# Patient Record
Sex: Female | Born: 1985
Health system: Southern US, Community
[De-identification: ages and names within clinical notes are randomized; demographics above are authoritative.]

## PROBLEM LIST (undated history)

## (undated) ENCOUNTER — Inpatient Hospital Stay (HOSPITAL_COMMUNITY): Payer: Self-pay

## (undated) DIAGNOSIS — R87619 Unspecified abnormal cytological findings in specimens from cervix uteri: Secondary | ICD-10-CM

## (undated) DIAGNOSIS — IMO0002 Reserved for concepts with insufficient information to code with codable children: Secondary | ICD-10-CM

## (undated) DIAGNOSIS — U071 COVID-19: Secondary | ICD-10-CM

## (undated) HISTORY — PX: DILATION AND CURETTAGE OF UTERUS: SHX78

---
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
## 2006-01-16 ENCOUNTER — Emergency Department (HOSPITAL_COMMUNITY): Admission: EM | Admit: 2006-01-16 | Discharge: 2006-01-16 | Payer: Self-pay | Admitting: Emergency Medicine

## 2006-01-25 ENCOUNTER — Emergency Department (HOSPITAL_COMMUNITY): Admission: EM | Admit: 2006-01-25 | Discharge: 2006-01-26 | Payer: Self-pay | Admitting: Emergency Medicine

## 2006-05-19 ENCOUNTER — Other Ambulatory Visit: Admission: RE | Admit: 2006-05-19 | Discharge: 2006-05-19 | Payer: Self-pay | Admitting: Obstetrics and Gynecology

## 2006-05-20 ENCOUNTER — Emergency Department (HOSPITAL_COMMUNITY): Admission: EM | Admit: 2006-05-20 | Discharge: 2006-05-20 | Payer: Self-pay | Admitting: Emergency Medicine

## 2006-05-26 ENCOUNTER — Inpatient Hospital Stay (HOSPITAL_COMMUNITY): Admission: AD | Admit: 2006-05-26 | Discharge: 2006-05-26 | Payer: Self-pay | Admitting: Obstetrics and Gynecology

## 2006-09-09 ENCOUNTER — Inpatient Hospital Stay (HOSPITAL_COMMUNITY): Admission: AD | Admit: 2006-09-09 | Discharge: 2006-09-09 | Payer: Self-pay | Admitting: Obstetrics and Gynecology

## 2006-09-09 ENCOUNTER — Inpatient Hospital Stay (HOSPITAL_COMMUNITY): Admission: AD | Admit: 2006-09-09 | Discharge: 2006-09-11 | Payer: Self-pay | Admitting: Obstetrics and Gynecology

## 2008-04-14 ENCOUNTER — Emergency Department (HOSPITAL_COMMUNITY): Admission: EM | Admit: 2008-04-14 | Discharge: 2008-04-15 | Payer: Self-pay | Admitting: Emergency Medicine

## 2008-06-05 ENCOUNTER — Emergency Department (HOSPITAL_COMMUNITY): Admission: EM | Admit: 2008-06-05 | Discharge: 2008-06-06 | Payer: Self-pay | Admitting: Emergency Medicine

## 2010-03-22 ENCOUNTER — Emergency Department (HOSPITAL_COMMUNITY)
Admission: EM | Admit: 2010-03-22 | Discharge: 2010-03-22 | Payer: Self-pay | Source: Home / Self Care | Admitting: Emergency Medicine

## 2010-09-11 ENCOUNTER — Emergency Department (HOSPITAL_COMMUNITY)
Admission: EM | Admit: 2010-09-11 | Discharge: 2010-09-11 | Payer: Self-pay | Source: Home / Self Care | Admitting: Emergency Medicine

## 2010-10-13 ENCOUNTER — Emergency Department (HOSPITAL_COMMUNITY)
Admission: EM | Admit: 2010-10-13 | Discharge: 2010-10-13 | Payer: Self-pay | Source: Home / Self Care | Admitting: Emergency Medicine

## 2010-10-15 LAB — URINALYSIS, ROUTINE W REFLEX MICROSCOPIC
Bilirubin Urine: NEGATIVE
Ketones, ur: 40 mg/dL — AB
Leukocytes, UA: NEGATIVE
Nitrite: NEGATIVE
Protein, ur: NEGATIVE mg/dL
Specific Gravity, Urine: 1.026 (ref 1.005–1.030)
Urine Glucose, Fasting: NEGATIVE mg/dL
Urobilinogen, UA: 1 mg/dL (ref 0.0–1.0)
pH: 7 (ref 5.0–8.0)

## 2010-10-15 LAB — URINE MICROSCOPIC-ADD ON

## 2010-10-15 LAB — POCT PREGNANCY, URINE: Preg Test, Ur: POSITIVE

## 2010-10-16 ENCOUNTER — Inpatient Hospital Stay (HOSPITAL_COMMUNITY)
Admission: AD | Admit: 2010-10-16 | Discharge: 2010-10-16 | Payer: Self-pay | Source: Home / Self Care | Attending: Obstetrics & Gynecology | Admitting: Obstetrics & Gynecology

## 2010-10-20 LAB — URINE MICROSCOPIC-ADD ON

## 2010-10-20 LAB — CBC
HCT: 37.1 % (ref 36.0–46.0)
Hemoglobin: 12.5 g/dL (ref 12.0–15.0)
MCH: 30.3 pg (ref 26.0–34.0)
MCHC: 33.7 g/dL (ref 30.0–36.0)
MCV: 89.8 fL (ref 78.0–100.0)
Platelets: 231 10*3/uL (ref 150–400)
RBC: 4.13 MIL/uL (ref 3.87–5.11)
RDW: 12 % (ref 11.5–15.5)
WBC: 7 10*3/uL (ref 4.0–10.5)

## 2010-10-20 LAB — URINALYSIS, ROUTINE W REFLEX MICROSCOPIC
Ketones, ur: >80 mg/dL — AB
Leukocytes, UA: NEGATIVE
Nitrite: NEGATIVE
Protein, ur: NEGATIVE mg/dL
Specific Gravity, Urine: 1.025 (ref 1.005–1.030)
Urine Glucose, Fasting: NEGATIVE mg/dL
Urobilinogen, UA: 2 mg/dL — ABNORMAL HIGH (ref 0.0–1.0)
pH: 6 (ref 5.0–8.0)

## 2010-10-20 LAB — WET PREP, GENITAL
Trich, Wet Prep: NONE SEEN
Yeast Wet Prep HPF POC: NONE SEEN

## 2010-10-20 LAB — GC/CHLAMYDIA PROBE AMP, GENITAL
Chlamydia, DNA Probe: NEGATIVE
GC Probe Amp, Genital: NEGATIVE

## 2010-10-20 LAB — ABO/RH: ABO/RH(D): O POS

## 2010-10-20 LAB — HCG, QUANTITATIVE, PREGNANCY: hCG, Beta Chain, Quant, S: 52327 m[IU]/mL — ABNORMAL HIGH (ref <5)

## 2010-10-25 ENCOUNTER — Inpatient Hospital Stay (HOSPITAL_COMMUNITY)
Admission: AD | Admit: 2010-10-25 | Discharge: 2010-10-26 | Payer: Self-pay | Source: Home / Self Care | Attending: Obstetrics & Gynecology | Admitting: Obstetrics & Gynecology

## 2010-10-25 LAB — URINALYSIS, ROUTINE W REFLEX MICROSCOPIC
Ketones, ur: >80 mg/dL — AB
Nitrite: NEGATIVE
Protein, ur: NEGATIVE mg/dL
Specific Gravity, Urine: 1.025 (ref 1.005–1.030)
Urine Glucose, Fasting: NEGATIVE mg/dL
Urobilinogen, UA: 2 mg/dL — ABNORMAL HIGH (ref 0.0–1.0)
pH: 6 (ref 5.0–8.0)

## 2010-10-25 LAB — URINE MICROSCOPIC-ADD ON

## 2010-10-28 LAB — URINE CULTURE: Culture  Setup Time: 201201291151

## 2010-12-08 LAB — URINALYSIS, ROUTINE W REFLEX MICROSCOPIC
Bilirubin Urine: NEGATIVE
Glucose, UA: NEGATIVE mg/dL
Ketones, ur: NEGATIVE mg/dL
Nitrite: NEGATIVE
Protein, ur: NEGATIVE mg/dL
Specific Gravity, Urine: 1.019 (ref 1.005–1.030)
Urobilinogen, UA: 1 mg/dL (ref 0.0–1.0)
pH: 8 (ref 5.0–8.0)

## 2010-12-08 LAB — URINE MICROSCOPIC-ADD ON

## 2010-12-08 LAB — POCT PREGNANCY, URINE: Preg Test, Ur: NEGATIVE

## 2010-12-14 LAB — URINE CULTURE: Colony Count: >=100000

## 2010-12-14 LAB — URINALYSIS, ROUTINE W REFLEX MICROSCOPIC
Bilirubin Urine: NEGATIVE
Glucose, UA: NEGATIVE mg/dL
Hgb urine dipstick: NEGATIVE
Ketones, ur: NEGATIVE mg/dL
Specific Gravity, Urine: 1.03 (ref 1.005–1.030)
pH: 6.5 (ref 5.0–8.0)

## 2010-12-14 LAB — WET PREP, GENITAL: Trich, Wet Prep: NONE SEEN

## 2010-12-14 LAB — URINE MICROSCOPIC-ADD ON

## 2010-12-14 LAB — GC/CHLAMYDIA PROBE AMP, GENITAL: Chlamydia, DNA Probe: NEGATIVE

## 2011-02-13 NOTE — Discharge Summary (Signed)
NAME:  Alexandra Davidson, Alexandra Davidson NO.:  [id number]   MEDICAL RECORD NO.:  [id number]          PATIENT TYPE:  INP   LOCATION:  9126                          FACILITY:  [facility]   PHYSICIAN:  Huel Cote, M.D. DATE OF BIRTH:  12-26-1985   DATE OF ADMISSION:  01/30/2005  DATE OF DISCHARGE:  02/02/2005                                 DISCHARGE SUMMARY   DISCHARGE DIAGNOSES:  1.  Preterm pregnancy at 36 plus weeks delivered.  2.  Fetal tachycardia.  3.  Positive group B strep status.  4.  Status post normal spontaneous vaginal delivery.  5.  Possible urinary tract infection and pyelonephritis.  6.  Postpartum.   HOSPITAL COURSE:  The patient is an 25 year old G1, P0 who was admitted at  36 plus weeks gestation with contractions every five minutes. Fetal heart  rate on admission was noted to be 180-200 which was reassuring with  accelerations but also tachycardic.  The patient was afebrile, however, was  admitted for observation. Fetal heart rate did improve to 150s with fluids  and IV hydration and the patient was observed see if her labor progressed.  Prenatal care had been complicated only by her positive group B strep status  in the urine. Prenatal labs were as follows:  O positive, antibody negative,  rubella immune, RPR nonreactive, hepatitis B surface antigen negative, HIV  nonreactive, GC negative, Chlamydia negative, group B strep positive, one-  hour Glucola 72, triple screen normal. She had no past obstetrical history.  No medical history and no surgical history.  Gynecologically, she had a  remote history of Chlamydia greater than one year ago.  It was negative this  pregnancy.   ALLERGIES:  None.   On admission, she was afebrile with stable vital signs. Fetal heart rate was  as described initially tachycardiac then improving to 150s.  Cervical exam  changed from 50 and 1 to 80 and 2 to 3 with contractions, confirming that  the patient was in labor.  She was  placed on penicillin for her group B  strep status.  A white blood cell count on admission was 11.5.  Given that  she was noted to be in labor, the rupture of membranes was then performed.  The patient continued to progress and did require some Pitocin augmentation.  She eventually reached complete dilation and pushed well with a normal  spontaneous vaginal delivery of a vigorous female infant over an intact  perineum. Apgars were 8 and 9, weight was 7 pounds even.  Cervix, perineum  and vagina were all intact. The urine was noted to be bloody during labor,  therefore was sent for urinalysis which was positive for blood and  leukocytes.  Given the tachycardia of the fetus and clinical picture, the  patient was treated for possible significant UTI with a one dose of  gentamycin and changed to Unasyn for several additional doses before  discontinuing.  She on postpartum day #2 was doing very well, was afebrile  with stable vital signs and was felt stable for discharge home.      KR/MEDQ  D:  03/18/2005  T:  03/18/2005  Job:  [id number]

## 2011-02-13 NOTE — Discharge Summary (Signed)
NAME:  Alexandra Davidson, FOGG NO.:  [id number]   MEDICAL RECORD NO.:  [id number]          PATIENT TYPE:  INP   LOCATION:  9145                          FACILITY:  [facility]   PHYSICIAN:  Malachi Pro. Ambrose Mantle, M.D. DATE OF BIRTH:  Dec 13, 1985   DATE OF ADMISSION:  09/09/2006  DATE OF DISCHARGE:  09/11/2006                               DISCHARGE SUMMARY   HISTORY:  This was a 25 year old black female para 0-1-0-1, gravida 2 at  39-3/[redacted] weeks gestation; admitted in active labor.  Her contractions were  increasing in intensity and frequency.  She had good fetal movement.  Her prenatal labs showed a blood group O+ with negative antibody. Sickle  cell negative.  GC and chlamydia were both positive, test cure was  negative.  RPR nonreactive, rubella immune.  Cystic fibrosis negative.  Hepatitis B surface antigen negative.  HIV negative.  A 1-hour Glucola  89.  Group B strep negative.   PAST MEDICAL HISTORY:  Negative.   SURGICAL HISTORY:  Negative.  The patient gave a history of a  spontaneous vaginal delivery at 36 weeks.  She had a history of abnormal  Paps.  Positive STDs with chlamydia and gonorrhea.  ]   MEDICATIONS:  She was on prenatal vitamins.   ALLERGIES:  She had NO KNOWN DRUG ALLERGIES.   SOCIAL HISTORY:  She was single.   FAMILY HISTORY:  Maternal grandmother with high blood pressure.  Paternal aunt with diabetes mellitus.   CURRENT PREGNANCY HISTORY:  Ultrasound due date was September 12, 2006 on  May 11, 2006.  at 22-2/[redacted] weeks gestation.  Normal anatomy was seen.  There was a posterior placenta female infant.   HOSPITAL COURSE:  On admission her vital signs were normal.  Heart and  lungs were normal.  The abdomen was soft and nontender.  Dr. Emeline Darling  exam showed the cervix to be 8-9 cm, 90%, vertex presentation, some  variable decelerations.  The patient progressed to complete dilatation  and pushed and delivered a living female infant; with Apgars of 8 and 9  at 1 and 5 minutes; 6 pounds 9 ounces.  The placenta was intact.  There  were no lacerations.  Blood loss was less than 500 cc.   Postpartum the patient did quite well.  She had no significant problems  and was discharged on the second postpartum day.   LABORATORY DATA:  Showed initial hemoglobin of 13.4, hematocrit 38.6,  white count 12,300, platelet count 232,000.  Follow-up hemoglobin 12.1,  RPR nonreactive.   FINAL DIAGNOSIS:  Intrauterine pregnancy at 39+ weeks; delivered OA.  Operation spontaneous delivery OA.   FINAL CONDITION:  Improved.   INSTRUCTIONS:  Include our regular discharge instruction booklet.  Percocet 5/325 mg 24 tablets one every 4-6 hours as needed for pain.  The patient is advised to return to the office in 6 weeks for a follow-  up examination.      Malachi Pro. Ambrose Mantle, M.D.  Electronically Signed     TFH/MEDQ  D:  09/11/2006  T:  09/11/2006  Job:  [id number]

## 2011-06-26 LAB — RAPID STREP SCREEN (MED CTR MEBANE ONLY): Streptococcus, Group A Screen (Direct): POSITIVE — AB

## 2011-08-20 ENCOUNTER — Inpatient Hospital Stay (HOSPITAL_COMMUNITY)
Admission: AD | Admit: 2011-08-20 | Discharge: 2011-08-20 | Disposition: A | Discharge status: Home / Self Care | Payer: Self-pay | Source: Ambulatory Visit | Attending: Obstetrics and Gynecology | Admitting: Obstetrics and Gynecology

## 2011-08-20 ENCOUNTER — Inpatient Hospital Stay (HOSPITAL_COMMUNITY): Payer: Self-pay

## 2011-08-20 ENCOUNTER — Encounter (HOSPITAL_COMMUNITY): Payer: Self-pay | Admitting: *Deleted

## 2011-08-20 DIAGNOSIS — O009 Unspecified ectopic pregnancy without intrauterine pregnancy: Secondary | ICD-10-CM | POA: Diagnosis present

## 2011-08-20 DIAGNOSIS — O209 Hemorrhage in early pregnancy, unspecified: Secondary | ICD-10-CM | POA: Insufficient documentation

## 2011-08-20 DIAGNOSIS — N760 Acute vaginitis: Secondary | ICD-10-CM | POA: Insufficient documentation

## 2011-08-20 DIAGNOSIS — A499 Bacterial infection, unspecified: Secondary | ICD-10-CM | POA: Insufficient documentation

## 2011-08-20 DIAGNOSIS — O2390 Unspecified genitourinary tract infection in pregnancy, unspecified trimester: Secondary | ICD-10-CM | POA: Insufficient documentation

## 2011-08-20 DIAGNOSIS — B9689 Other specified bacterial agents as the cause of diseases classified elsewhere: Secondary | ICD-10-CM | POA: Insufficient documentation

## 2011-08-20 HISTORY — DX: Reserved for concepts with insufficient information to code with codable children: IMO0002

## 2011-08-20 HISTORY — DX: Unspecified abnormal cytological findings in specimens from cervix uteri: R87.619

## 2011-08-20 LAB — URINALYSIS, ROUTINE W REFLEX MICROSCOPIC
Bilirubin Urine: NEGATIVE
Ketones, ur: 40 mg/dL — AB
Nitrite: NEGATIVE
Specific Gravity, Urine: >1.03 — ABNORMAL HIGH (ref 1.005–1.030)
pH: 6 (ref 5.0–8.0)

## 2011-08-20 LAB — AST: AST: 10 U/L (ref 0–37)

## 2011-08-20 LAB — WET PREP, GENITAL: Yeast Wet Prep HPF POC: NONE SEEN

## 2011-08-20 LAB — URINE MICROSCOPIC-ADD ON

## 2011-08-20 LAB — CBC
HCT: 34.1 % — ABNORMAL LOW (ref 36.0–46.0)
Hemoglobin: 11.5 g/dL — ABNORMAL LOW (ref 12.0–15.0)
MCH: 31.1 pg (ref 26.0–34.0)
MCHC: 33.7 g/dL (ref 30.0–36.0)
MCV: 92.2 fL (ref 78.0–100.0)
RDW: 11.7 % (ref 11.5–15.5)

## 2011-08-20 LAB — CREATININE, SERUM
GFR calc Af Amer: >90 mL/min (ref >90)
GFR calc non Af Amer: >90 mL/min (ref >90)

## 2011-08-20 MED ORDER — METHOTREXATE INJECTION FOR WOMEN'S HOSPITAL
50.0000 mg/m2 | Freq: Once | INTRAMUSCULAR | Status: DC
  Filled 2011-08-20: qty 2.2

## 2011-08-20 MED ORDER — METRONIDAZOLE 500 MG PO TABS: 500.0000 mg | ORAL_TABLET | Freq: Two times a day (BID) | ORAL | Status: AC

## 2011-08-20 MED ORDER — PROMETHAZINE HCL 25 MG PO TABS: 25.0000 mg | ORAL_TABLET | Freq: Four times a day (QID) | ORAL | Status: DC | PRN

## 2011-08-20 MED ORDER — DEXTROSE 5 % IN LACTATED RINGERS IV BOLUS
1000.0000 mL | Freq: Once | INTRAVENOUS | Status: AC
  Administered 2011-08-20: 1000 mL via INTRAVENOUS

## 2011-08-20 MED ORDER — ONDANSETRON HCL 4 MG/2ML IJ SOLN
4.0000 mg | Freq: Once | INTRAMUSCULAR | Status: AC
  Administered 2011-08-20: 4 mg via INTRAVENOUS
  Filled 2011-08-20: qty 2

## 2011-08-20 NOTE — Progress Notes (Signed)
Pt G4 P2 at 12wks, having small amt of dark blood and nausea.

## 2011-08-20 NOTE — ED Notes (Signed)
Pt requesting to talk to Dr. Emelda Fear. Unsure if she want methotrexate now. CNM aware and will notify.

## 2011-08-20 NOTE — ED Provider Notes (Signed)
History     Chief Complaint  Patient presents with  . Vaginal Bleeding  . Nausea   HPI 25 y.o. G1P0 at [redacted]w[redacted]d by LMP, periods are very irregular, with nausea and vaginal bleeding, light pink starting on Monday, tonight bright red with wiping. Last intercourse yesterday.   Past Medical History  Diagnosis Date  . Abnormal Pap smear     History reviewed. No pertinent past surgical history.  History reviewed. No pertinent family history.  History  Substance Use Topics  . Smoking status: Never Smoker   . Smokeless tobacco: Not on file  . Alcohol Use: No    Allergies: No Known Allergies  Prescriptions prior to admission  Medication Sig Dispense Refill  . acetaminophen (TYLENOL) 325 MG tablet Take 325 mg by mouth every 6 (six) hours as needed. For pain       . Dextromethorphan-Guaifenesin (CHILDRENS COUGH PO) Take 5 mLs by mouth daily as needed. For cough       . prenatal vitamin w/FE, FA (PRENATAL 1 + 1) 27-1 MG TABS Take 1 tablet by mouth daily.          Review of Systems  Constitutional: Negative.   Respiratory: Negative.   Cardiovascular: Negative.   Gastrointestinal: Positive for nausea. Negative for vomiting, abdominal pain, diarrhea and constipation.  Genitourinary: Negative for dysuria, urgency, frequency, hematuria and flank pain.       Positive for vaginal bleeding   Musculoskeletal: Negative.   Neurological: Negative.   Psychiatric/Behavioral: Negative.    Physical Exam   Blood pressure 105/54, pulse 64, temperature 97.1 F (36.2 C), temperature source Oral, resp. rate 20, height 5\' 8"  (1.727 m), weight 97.433 kg (214 lb 12.8 oz).  Physical Exam  Nursing note and vitals reviewed. Constitutional: She is oriented to person, place, and time. She appears well-developed and well-nourished. No distress.  Cardiovascular: Normal rate.   GI: Soft. There is no tenderness.  Genitourinary: Uterus is not tender. Right adnexum displays no mass, no tenderness and no  fullness. Left adnexum displays no mass, no tenderness and no fullness. There is bleeding (light pink mucous) around the vagina.  Musculoskeletal: Normal range of motion.  Neurological: She is alert and oriented to person, place, and time.  Skin: Skin is warm and dry.  Psychiatric: She has a normal mood and affect.    MAU Course  Procedures Results for orders placed during the hospital encounter of 08/20/11 (from the past 24 hour(s))  URINALYSIS, ROUTINE W REFLEX MICROSCOPIC     Status: Abnormal   Collection Time   08/20/11  3:28 AM      Component Value Range   Color, Urine YELLOW  YELLOW    Appearance CLEAR  CLEAR    Specific Gravity, Urine >1.030 (*) 1.005 - 1.030    pH 6.0  5.0 - 8.0    Glucose, UA NEGATIVE  NEGATIVE (mg/dL)   Hgb urine dipstick LARGE (*) NEGATIVE    Bilirubin Urine NEGATIVE  NEGATIVE    Ketones, ur 40 (*) NEGATIVE (mg/dL)   Protein, ur NEGATIVE  NEGATIVE (mg/dL)   Urobilinogen, UA 0.2  0.0 - 1.0 (mg/dL)   Nitrite NEGATIVE  NEGATIVE    Leukocytes, UA NEGATIVE  NEGATIVE   URINE MICROSCOPIC-ADD ON     Status: Abnormal   Collection Time   08/20/11  3:28 AM      Component Value Range   Squamous Epithelial / LPF MANY (*) RARE    WBC, UA 0-2  <3 (WBC/hpf)  RBC / HPF 3-6  <3 (RBC/hpf)   Urine-Other MUCOUS PRESENT    CBC     Status: Abnormal   Collection Time   08/20/11  4:35 AM      Component Value Range   WBC 7.6  4.0 - 10.5 (K/uL)   RBC 3.70 (*) 3.87 - 5.11 (MIL/uL)   Hemoglobin 11.5 (*) 12.0 - 15.0 (g/dL)   HCT 16.1 (*) 09.6 - 46.0 (%)   MCV 92.2  78.0 - 100.0 (fL)   MCH 31.1  26.0 - 34.0 (pg)   MCHC 33.7  30.0 - 36.0 (g/dL)   RDW 04.5  40.9 - 81.1 (%)   Platelets 214  150 - 400 (K/uL)  HCG, QUANTITATIVE, PREGNANCY     Status: Abnormal   Collection Time   08/20/11  4:35 AM      Component Value Range   hCG, Beta Chain, Quant, S 7712 (*) <5 (mIU/mL)  WET PREP, GENITAL     Status: Abnormal   Collection Time   08/20/11  5:25 AM      Component Value  Range   Yeast, Wet Prep NONE SEEN  NONE SEEN    Trich, Wet Prep NONE SEEN  NONE SEEN    Clue Cells, Wet Prep MODERATE (*) NONE SEEN    WBC, Wet Prep HPF POC FEW (*) NONE SEEN    US Ob Comp Less 14 Wks  08/20/2011  *RADIOLOGY REPORT*  Clinical Data: Nausea, vomiting, bleeding. Early pregnancy.  OBSTETRIC <14 WK [place] AND TRANSVAGINAL OB US  Technique:  Both transabdominal and transvaginal ultrasound examinations were performed for complete evaluation of the gestation as well as the maternal uterus, adnexal regions, and pelvic cul-de-sac.  Transvaginal technique was performed to assess early pregnancy.  Comparison:  None.  Intrauterine gestational sac:  Not identified Yolk sac: Not identified Embryo: Not identified Cardiac Activity: Not applicable  Maternal uterus/adnexae: Ovaries have normal sonographic appearance.  Thickened endometrium. No adnexal mass identified.  No free fluid.  IMPRESSION: No intrauterine gestation identified.  No adnexal mass or free fluid. At the time of this examination, a quantitative beta HCG is pending.  Recommend serial quantitative beta HCG and short-term ultrasound follow-up.  Original Report Authenticated By: Waneta Martins, M.D.    Assessment and Plan  25 y.o. G2P0010, unsure dates, with vaginal bleeding and BV Quant HCG had to be sent out, after 2 hours lab states it will be at least another hour, pt states she cannot wait any longer d/t childcare issues at home. She agrees to come back in if we need her to, phone number verified.  Rx flagyl and phenergan  Terik Haughey 08/20/2011, 7:21 AM   Quant HCG 7712. Pt called back and immediately returned to MAU. Discussed ectopic and treatment with Methotrexate with patient. Dr. Emelda Fear to MAU to see patient. BUN, Creatinine, AST pending.  Pt expressed concern with moving forward with MTX treatment upon arrival to MAU. Dr. Emelda Fear discussed u/s with radiologist. Considering that patient appears to be very stable and  that u/s is not clearly diagnostic for ectopic, it would be reasonable to repeat ultrasound and quant on Friday. Precuations rev'd. Pt will return tomorrow or sooner with development of pain or increased bleeding.

## 2011-08-20 NOTE — ED Notes (Signed)
Pt decided to wait. U/s scheduled for tomorrow. Instructions for ectopic pregnancy given. Pt verbalized understanding.

## 2011-08-21 ENCOUNTER — Encounter (HOSPITAL_COMMUNITY): Payer: Self-pay | Admitting: *Deleted

## 2011-08-21 ENCOUNTER — Other Ambulatory Visit: Payer: Self-pay | Admitting: Obstetrics & Gynecology

## 2011-08-21 ENCOUNTER — Ambulatory Visit (HOSPITAL_COMMUNITY)
Admission: AD | Admit: 2011-08-21 | Discharge: 2011-08-21 | Disposition: A | Discharge status: Home / Self Care | Payer: Self-pay | Source: Ambulatory Visit | Attending: Obstetrics & Gynecology | Admitting: Obstetrics & Gynecology

## 2011-08-21 ENCOUNTER — Encounter (HOSPITAL_COMMUNITY)
Admission: AD | Disposition: A | Discharge status: Home / Self Care | Payer: Self-pay | Source: Ambulatory Visit | Attending: Obstetrics & Gynecology

## 2011-08-21 ENCOUNTER — Ambulatory Visit (HOSPITAL_COMMUNITY): Payer: Self-pay

## 2011-08-21 ENCOUNTER — Inpatient Hospital Stay (HOSPITAL_COMMUNITY): Payer: Self-pay | Admitting: Anesthesiology

## 2011-08-21 ENCOUNTER — Encounter (HOSPITAL_COMMUNITY): Payer: Self-pay | Admitting: Anesthesiology

## 2011-08-21 ENCOUNTER — Inpatient Hospital Stay (HOSPITAL_COMMUNITY): Payer: Self-pay

## 2011-08-21 DIAGNOSIS — O00109 Unspecified tubal pregnancy without intrauterine pregnancy: Principal | ICD-10-CM | POA: Insufficient documentation

## 2011-08-21 DIAGNOSIS — O00119 Unspecified tubal pregnancy with intrauterine pregnancy: Secondary | ICD-10-CM

## 2011-08-21 HISTORY — PX: LAPAROSCOPY: SHX197

## 2011-08-21 LAB — GC/CHLAMYDIA PROBE AMP, GENITAL: Chlamydia, DNA Probe: NEGATIVE

## 2011-08-21 SURGERY — LAPAROSCOPY OPERATIVE
Wound class: Clean Contaminated

## 2011-08-21 MED ORDER — NEOSTIGMINE METHYLSULFATE 1 MG/ML IJ SOLN
INTRAMUSCULAR | Status: DC | PRN
  Administered 2011-08-21: 2.5 mg via INTRAVENOUS

## 2011-08-21 MED ORDER — ONDANSETRON HCL 4 MG/2ML IJ SOLN
INTRAMUSCULAR | Status: DC | PRN
  Administered 2011-08-21: 4 mg via INTRAVENOUS

## 2011-08-21 MED ORDER — KETOROLAC TROMETHAMINE 30 MG/ML IJ SOLN
INTRAMUSCULAR | Status: DC | PRN
  Administered 2011-08-21: 30 mg via INTRAVENOUS

## 2011-08-21 MED ORDER — DEXAMETHASONE SODIUM PHOSPHATE 10 MG/ML IJ SOLN
INTRAMUSCULAR | Status: DC | PRN
  Administered 2011-08-21: 10 mg via INTRAVENOUS

## 2011-08-21 MED ORDER — MIDAZOLAM HCL 5 MG/5ML IJ SOLN
INTRAMUSCULAR | Status: DC | PRN
  Administered 2011-08-21: 2 mg via INTRAVENOUS

## 2011-08-21 MED ORDER — FENTANYL CITRATE 0.05 MG/ML IJ SOLN
25.0000 ug | INTRAMUSCULAR | Status: DC | PRN
  Administered 2011-08-21: 25 ug via INTRAVENOUS

## 2011-08-21 MED ORDER — MEPERIDINE HCL 25 MG/ML IJ SOLN: 6.2500 mg | INTRAMUSCULAR | Status: DC | PRN

## 2011-08-21 MED ORDER — BUPIVACAINE HCL 0.25 % IJ SOLN
INTRAMUSCULAR | Status: DC | PRN
  Administered 2011-08-21: 10 mL

## 2011-08-21 MED ORDER — LIDOCAINE HCL (CARDIAC) 20 MG/ML IV SOLN
INTRAVENOUS | Status: DC | PRN
  Administered 2011-08-21: 80 mg via INTRAVENOUS

## 2011-08-21 MED ORDER — OXYCODONE-ACETAMINOPHEN 5-325 MG PO TABS: 1.0000 | ORAL_TABLET | ORAL | Status: AC | PRN

## 2011-08-21 MED ORDER — ROCURONIUM BROMIDE 100 MG/10ML IV SOLN
INTRAVENOUS | Status: DC | PRN
  Administered 2011-08-21: 50 mg via INTRAVENOUS

## 2011-08-21 MED ORDER — RINGERS IRRIGATION IR SOLN
Status: DC | PRN
  Administered 2011-08-21: 1

## 2011-08-21 MED ORDER — GLYCOPYRROLATE 0.2 MG/ML IJ SOLN
INTRAMUSCULAR | Status: DC | PRN
  Administered 2011-08-21: .5 mg via INTRAVENOUS

## 2011-08-21 MED ORDER — ONDANSETRON HCL 4 MG/2ML IJ SOLN: 4.0000 mg | Freq: Once | INTRAMUSCULAR | Status: DC | PRN

## 2011-08-21 MED ORDER — PROPOFOL 10 MG/ML IV EMUL
INTRAVENOUS | Status: DC | PRN
  Administered 2011-08-21: 170 mg via INTRAVENOUS

## 2011-08-21 MED ORDER — LACTATED RINGERS IV SOLN
INTRAVENOUS | Status: DC
  Administered 2011-08-21 (×3): via INTRAVENOUS

## 2011-08-21 MED ORDER — KETOROLAC TROMETHAMINE 30 MG/ML IJ SOLN: 15.0000 mg | Freq: Once | INTRAMUSCULAR | Status: DC | PRN

## 2011-08-21 MED ORDER — FENTANYL CITRATE 0.05 MG/ML IJ SOLN
INTRAMUSCULAR | Status: DC | PRN
  Administered 2011-08-21: 150 ug via INTRAVENOUS
  Administered 2011-08-21: 100 ug via INTRAVENOUS

## 2011-08-21 SURGICAL SUPPLY — 27 items
CHLORAPREP W/TINT 26ML (MISCELLANEOUS) ×2 IMPLANT
CLOTH BEACON ORANGE TIMEOUT ST (SAFETY) ×2 IMPLANT
ELECT REM PT RETURN 9FT ADLT (ELECTROSURGICAL) ×2
ELECTRODE REM PT RTRN 9FT ADLT (ELECTROSURGICAL) ×1 IMPLANT
EXTRACTOR VACUUM KIWI (MISCELLANEOUS) IMPLANT
GLOVE BIO SURGEON STRL SZ 6.5 (GLOVE) ×2 IMPLANT
GLOVE BIOGEL PI IND STRL 7.0 (GLOVE) ×2 IMPLANT
GLOVE BIOGEL PI INDICATOR 7.0 (GLOVE) ×2
GOWN PREVENTION PLUS LG XLONG (DISPOSABLE) ×8 IMPLANT
KIT ABG SYR 3ML LUER SLIP (SYRINGE) IMPLANT
NEEDLE HYPO 25X5/8 SAFETYGLIDE (NEEDLE) IMPLANT
NS IRRIG 1000ML POUR BTL (IV SOLUTION) ×2 IMPLANT
PACK LAPAROSCOPY BASIN (CUSTOM PROCEDURE TRAY) ×2 IMPLANT
POUCH SPECIMEN RETRIEVAL 10MM (ENDOMECHANICALS) ×2 IMPLANT
SCALPEL HARMONIC ACE (MISCELLANEOUS) ×2 IMPLANT
SLEEVE SCD COMPRESS KNEE LRG (MISCELLANEOUS) ×2 IMPLANT
SLEEVE SCD COMPRESS KNEE MED (MISCELLANEOUS) IMPLANT
SUT VIC AB 0 CT1 36 (SUTURE) ×12 IMPLANT
SUT VIC AB 2-0 CT1 27 (SUTURE) ×2
SUT VIC AB 2-0 CT1 TAPERPNT 27 (SUTURE) ×1 IMPLANT
SUT VIC AB 4-0 PS2 27 (SUTURE) ×2 IMPLANT
TOWEL OR 17X24 6PK STRL BLUE (TOWEL DISPOSABLE) ×4 IMPLANT
TRAY FOLEY CATH 14FR (SET/KITS/TRAYS/PACK) IMPLANT
TROCAR 5M 150ML BLDLS (TROCAR) ×2 IMPLANT
TROCAR XCEL NON-BLD 11X100MML (ENDOMECHANICALS) ×4 IMPLANT
TUBING INSUFFLATION 10FT LAP (TUBING) ×2 IMPLANT
WATER STERILE IRR 1000ML POUR (IV SOLUTION) ×2 IMPLANT

## 2011-08-21 NOTE — Progress Notes (Signed)
Patient to MAU to have a repeat BHCG. Patient is also scheduled for a repeat ultrasound. Patient denies any pain or bleeding.

## 2011-08-21 NOTE — ED Notes (Signed)
Pt returned from [place]. Henrietta Hoover PA at bedside discussing plan of care. ( Pt has living ectopic).

## 2011-08-21 NOTE — Anesthesia Postprocedure Evaluation (Signed)
Anesthesia Post Note  Patient: Alexandra Davidson  Procedure(s) Performed:  LAPAROSCOPY OPERATIVE - Operative Laparoscopy with Left Partial Salpingectomy  Anesthesia type: General  Patient location: PACU  Post pain: Pain level controlled  Post assessment: Post-op Vital signs reviewed  Last Vitals:  Filed Vitals:   08/21/11 1745  BP: 129/74  Pulse: 69  Temp:   Resp: 19    Post vital signs: Reviewed  Level of consciousness: sedated  Complications: No apparent anesthesia complicationsfj

## 2011-08-21 NOTE — Op Note (Signed)
Procedure: Laparoscopy and partial left salpingectomy Preop diagnosis: Left tubal ectopic pregnancy with fetal heart motion seen on ultrasound Postop diagnosis: Same Surgeon: Dr. Scheryl Darter Assistant: Dr. Candelaria Celeste Anesthesia: Gen. endotracheal Specimen: Portion of left fallopian tube with ectopic pregnancy Complications: None Estimated blood loss less than 25 mL Drains: None Counts: Correct  The patient gave written consent for laparoscopy and removal of ectopic pregnancy with possible salpingectomy due to diagnosis of the left bleeding ectopic pregnancy at 6 weeks 1 day gestation based on ultrasound. Patient identification was confirmed and she was brought to the operating room and she was placed in dorsal lithotomy position. General endotracheal anesthesia was induced. Abdomen perineum and vagina were sterilely prepped and draped. A Foley catheter was placed and a Hulka tenaculum was placed on the cervix. A #11 blade was used to make a vertical infraumbilical skin incision approximately one and half centimeters long. The abdominal wall was elevated and a Veress needle was placed. CO2 was insufflated for a pneumoperitoneum. The needle was removed and an 11 mm trocar was placed. The diagnostic laparoscope was inserted with video camera and use. The uterus was then manipulated with the Hulka tenaculum. The left tube was dilated and swollen just distal to the cornu and about 5 cm of the proximal tube was involved in the ectopic pregnancy. Both ovaries appeared normal and the right fallopian tube appeared normal. There was no hemoperitoneum. Elected to proceed with excising the ectopic pregnancy with a partial left salpingectomy. 5 mm trochars were placed in the left lower quadrant and right lower quadrant. The tube was elevated with a grasper. The harmonic scalpel was used to excise the dilated portion of the left fallopian tube. A few adhesions in the left adnexa were taken down with scalpel as  well. Good hemostasis was seen. The Endopouch was inserted and this was used to remove the specimen. This was sent to pathology. The pelvis was irrigated. All instruments removed. The pneumoperitoneum was released. 0 Vicryl suture was used to close the fascia at the umbilicus. The skin at the umbilicus was closed with 4-0 Vicryl and subcuticular layer. The lower bowel incisions were closed with Dermabond. Dermabond was applied at the umbilicus as well. The Hulka tenaculum was removed. The patient tolerated the procedure well without complications. Estimated blood loss was less than 25 mL. She was brought in stable condition to the PACU.  Dr. Scheryl Darter 08/21/2011

## 2011-08-21 NOTE — H&P (Addendum)
Alexandra Davidson is an 25 y.o. female. [redacted]w[redacted]d No LMP recorded. Patient is pregnant.She returns today for repeat HCG after she was seen in MAU yesterday with possible ectopic pregnancy. Ultrasound 08/20/11 showed no IUP, no adnexal  mass. No bleeding today. Left ectopic noted on [place] today with fetal heart rate 120.     Pertinent Gynecological History: Menses irregular Bleeding: light yesterday Contraception: none DES exposure: denies Blood transfusions: none Sexually transmitted diseases: cervical dysplasia, chlamydia  Previous GYN Procedures: DNC  Last mammogram: n/a Date: n/a  Last pap: normal Date: unknown OB History: G4, P2, A1   Menstrual History: Menarche age: not recorded No LMP recorded. Patient is pregnant.    Past Medical History  Diagnosis Date  . Abnormal Pap smear     Past Surgical History  Procedure Date  . No past surgeries   DandC for termination   Family History  Problem Relation Age of Onset  . Anesthesia problems Neg Hx   . Hypotension Neg Hx   . Malignant hyperthermia Neg Hx   . Pseudochol deficiency Neg Hx     Social History:  reports that she has never smoked. She does not have any smokeless tobacco history on file. She reports that she does not drink alcohol or use illicit drugs.  Allergies: No Known Allergies  Prescriptions prior to admission  Medication Sig Dispense Refill  . acetaminophen (TYLENOL) 325 MG tablet Take 325 mg by mouth every 6 (six) hours as needed. For pain       . Dextromethorphan-Guaifenesin (CHILDRENS COUGH PO) Take 5 mLs by mouth daily as needed. For cough       . metroNIDAZOLE (FLAGYL) 500 MG tablet Take 1 tablet (500 mg total) by mouth 2 (two) times daily.  14 tablet  0  . prenatal vitamin w/FE, FA (PRENATAL 1 + 1) 27-1 MG TABS Take 1 tablet by mouth daily.        . promethazine (PHENERGAN) 25 MG tablet Take 1 tablet (25 mg total) by mouth every 6 (six) hours as needed for nausea.  60 tablet  0    Review of Systems    Constitutional: Negative.   Neurological: Negative for headaches.   No pain or bleeding  Blood pressure 125/76, pulse 79, temperature 98.7 F (37.1 C), temperature source Oral, resp. rate 16, SpO2 99.00%. Physical Exam  Constitutional: She is oriented to person, place, and time. She appears well-developed. No distress.  Cardiovascular: Normal rate.   Respiratory: Breath sounds normal.  GI: Soft. She exhibits no mass. There is no tenderness.  Genitourinary:       Deferred today  Neurological: She is alert and oriented to person, place, and time.  Skin: Skin is warm and dry.  Psychiatric: She has a normal mood and affect.    Results for orders placed during the hospital encounter of 08/21/11 (from the past 24 hour(s))  HCG, QUANTITATIVE, PREGNANCY     Status: Abnormal   Collection Time   08/21/11  2:15 PM      Component Value Range   hCG, Beta Chain, Quant, S 9042 (*) <5 (mIU/mL)    US Ob Comp Less 14 Wks  08/20/2011  *RADIOLOGY REPORT*  Clinical Data: Nausea, vomiting, bleeding. Early pregnancy.  OBSTETRIC <14 WK [place] AND TRANSVAGINAL OB US  Technique:  Both transabdominal and transvaginal ultrasound examinations were performed for complete evaluation of the gestation as well as the maternal uterus, adnexal regions, and pelvic cul-de-sac.  Transvaginal technique was performed to assess  early pregnancy.  Comparison:  None.  Intrauterine gestational sac:  Not identified Yolk sac: Not identified Embryo: Not identified Cardiac Activity: Not applicable  Maternal uterus/adnexae: Ovaries have normal sonographic appearance.  Thickened endometrium. No adnexal mass identified.  No free fluid.  IMPRESSION: No intrauterine gestation identified.  No adnexal mass or free fluid. At the time of this examination, a quantitative beta HCG is pending.  Recommend serial quantitative beta HCG and short-term ultrasound follow-up.  Original Report Authenticated By: Waneta Martins, M.D.   US Ob  Transvaginal  08/21/2011  OBSTETRICAL ULTRASOUND: This exam was performed within a [facility]. The OB US report was generated in the [facility] system, and faxed to the ordering physician.   This report is also available in [employer] and in the [employer]. See [facility] Obstetric US report.   US Ob Transvaginal  08/20/2011  *RADIOLOGY REPORT*  Clinical Data: Nausea, vomiting, bleeding. Early pregnancy.  OBSTETRIC <14 WK [place] AND TRANSVAGINAL OB US  Technique:  Both transabdominal and transvaginal ultrasound examinations were performed for complete evaluation of the gestation as well as the maternal uterus, adnexal regions, and pelvic cul-de-sac.  Transvaginal technique was performed to assess early pregnancy.  Comparison:  None.  Intrauterine gestational sac:  Not identified Yolk sac: Not identified Embryo: Not identified Cardiac Activity: Not applicable  Maternal uterus/adnexae: Ovaries have normal sonographic appearance.  Thickened endometrium. No adnexal mass identified.  No free fluid.  IMPRESSION: No intrauterine gestation identified.  No adnexal mass or free fluid. At the time of this examination, a quantitative beta HCG is pending.  Recommend serial quantitative beta HCG and short-term ultrasound follow-up.  Original Report Authenticated By: Waneta Martins, M.D.  08/21/11  [redacted]w[redacted]d living left ectopic pregnancy, no free fluid,  Assessment/Plan: Ectopic pregnancy on left. Not a candidate for MTX due to living fetus present. I offered laparoscopy and removal of the ectopic and possible salpingectomy. The risk of anesthesia, bleeding, infection, bowel and urinary tract damage was discussed and her questions were answered. She has binot eated since 2000 08/20/11.  Nyheim Seufert 08/21/2011, 3:54 PM

## 2011-08-21 NOTE — Anesthesia Preprocedure Evaluation (Signed)
Anesthesia Evaluation  Patient identified by MRN, date of birth, ID band Patient awake    Reviewed: Allergy & Precautions, H&P , NPO status , Patient's Chart, lab work & pertinent test results  Airway Mallampati: II TM Distance: >3 FB Neck ROM: full    Dental No notable dental hx. (+) Teeth Intact   Pulmonary neg pulmonary ROS,    Pulmonary exam normal       Cardiovascular neg cardio ROS     Neuro/Psych Negative Neurological ROS  Negative Psych ROS   GI/Hepatic negative GI ROS, Neg liver ROS,   Endo/Other  Negative Endocrine ROS  Renal/GU negative Renal ROS  Genitourinary negative   Musculoskeletal negative musculoskeletal ROS (+)   Abdominal Normal abdominal exam  (+)   Peds negative pediatric ROS (+)  Hematology negative hematology ROS (+)   Anesthesia Other Findings   Reproductive/Obstetrics negative OB ROS                           Anesthesia Physical Anesthesia Plan  ASA: II and Emergent  Anesthesia Plan: General   Post-op Pain Management:    Induction: Intravenous  Airway Management Planned: Oral ETT  Additional Equipment:   Intra-op Plan:   Post-operative Plan: Extubation in OR  Informed Consent: I have reviewed the patients History and Physical, chart, labs and discussed the procedure including the risks, benefits and alternatives for the proposed anesthesia with the patient or authorized representative who has indicated his/her understanding and acceptance.   Dental Advisory Given  Plan Discussed with: CRNA  Anesthesia Plan Comments:         Anesthesia Quick Evaluation

## 2011-08-21 NOTE — ED Notes (Signed)
Chg bath doneand op permit signed

## 2011-08-21 NOTE — ED Provider Notes (Signed)
History   Pt presents today for repeat US to r/o ectopic preg. Pt was seen yesterday and offered methotrexate for suspected ectopic preg. There was no IUP or ectopic preg seen on [place] yesterday. Pt denies vag bleeding or pain today.  Chief Complaint  Patient presents with  . Follow-up   HPI  OB History    Grav Para Term Preterm Abortions TAB SAB Ect Mult Living   2    1 1           Past Medical History  Diagnosis Date  . Abnormal Pap smear     No past surgical history on file.  No family history on file.  History  Substance Use Topics  . Smoking status: Never Smoker   . Smokeless tobacco: Not on file  . Alcohol Use: No    Allergies: No Known Allergies  Prescriptions prior to admission  Medication Sig Dispense Refill  . acetaminophen (TYLENOL) 325 MG tablet Take 325 mg by mouth every 6 (six) hours as needed. For pain       . Dextromethorphan-Guaifenesin (CHILDRENS COUGH PO) Take 5 mLs by mouth daily as needed. For cough       . metroNIDAZOLE (FLAGYL) 500 MG tablet Take 1 tablet (500 mg total) by mouth 2 (two) times daily.  14 tablet  0  . prenatal vitamin w/FE, FA (PRENATAL 1 + 1) 27-1 MG TABS Take 1 tablet by mouth daily.        . promethazine (PHENERGAN) 25 MG tablet Take 1 tablet (25 mg total) by mouth every 6 (six) hours as needed for nausea.  60 tablet  0    Review of Systems  Constitutional: Negative for fever.  Cardiovascular: Negative for chest pain.  Gastrointestinal: Negative for nausea, vomiting, abdominal pain, diarrhea and constipation.  Genitourinary: Negative for dysuria, urgency, frequency and hematuria.  Neurological: Negative for dizziness and headaches.  Psychiatric/Behavioral: Negative for depression and suicidal ideas.   Physical Exam   Blood pressure 125/76, pulse 79, temperature 98.7 F (37.1 C), temperature source Oral, resp. rate 16, SpO2 99.00%.  Physical Exam  Nursing note and vitals reviewed. Constitutional: She is oriented to person,  place, and time. She appears well-developed and well-nourished. No distress.  HENT:  Head: Normocephalic and atraumatic.  GI: Soft. She exhibits no distension. There is no tenderness. There is no rebound and no guarding.  Neurological: She is alert and oriented to person, place, and time.  Skin: Skin is warm and dry. She is not diaphoretic.  Psychiatric: She has a normal mood and affect. Her behavior is normal. Judgment and thought content normal.    MAU Course  Procedures  US shows living ectopic preg in left adnexa.  Dr. Debroah Loop notified and on his was to assume care of the pt.  Assessment and Plan  Living ectopic preg.  Alexandra Davidson. Alexandra Davidson, DrHSc, MPAS, PA-C    08/21/2011, 3:22 PM   Alexandra Hoover, PA 08/21/11 1527

## 2011-08-21 NOTE — ED Notes (Signed)
Dr Debroah Loop in with pt discussing plan of care and surgery

## 2011-08-21 NOTE — Transfer of Care (Signed)
Immediate Anesthesia Transfer of Care Note  Patient: Alexandra Davidson  Procedure(s) Performed:  LAPAROSCOPY OPERATIVE - Operative Laparoscopy with Left Partial Salpingectomy  Patient Location: PACU  Anesthesia Type: General  Level of Consciousness: alert  and oriented  Airway & Oxygen Therapy: Patient Spontanous Breathing and Patient connected to nasal cannula oxygen  Post-op Assessment: Report given to PACU RN and Post -op Vital signs reviewed and stable  Post vital signs: stable  Complications: No apparent anesthesia complications

## 2011-08-22 ENCOUNTER — Ambulatory Visit (HOSPITAL_COMMUNITY): Payer: Self-pay

## 2011-08-24 ENCOUNTER — Encounter (HOSPITAL_COMMUNITY): Payer: Self-pay | Admitting: Obstetrics & Gynecology

## 2011-08-24 NOTE — ED Provider Notes (Signed)
Attestation of Attending Supervision of Advanced Practitioner: Evaluation and management procedures were performed by the PA/NP/CNM/OB Fellow under my supervision/collaboration. Chart reviewed and agree with management and plan. I reviewed the ultrasound with the Radiologist, then discussed findings with the patient, who desires followup, in view of her quite stable condition.  As discussed with radiology, wil schedule q hcg and u/s for 36 hours, Friday afternoon.  Tilda Burrow 08/24/2011 6:29 PM

## 2011-09-29 NOTE — L&D Delivery Note (Signed)
Delivery Note At 4:48 AM a viable female was delivered via Vaginal, Spontaneous Delivery (Presentation: Middle Occiput Anterior).  APGAR: 5,8 ; weight . pending Placenta status: Intact, Spontaneous.  Cord: 3 vessels with the following complications: nuchal cord x2, under arms.  Cord pH: n/a  Anesthesia: Epidural  Episiotomy: None Lacerations: None Suture Repair: none needed Est. Blood Loss (mL):  Mom to postpartum.  Baby to nursery-stable.  Sonia Side 07/14/2012, 4:59 AM   I was present for delivery and agree with above

## 2011-11-13 ENCOUNTER — Inpatient Hospital Stay (HOSPITAL_COMMUNITY): Payer: No Typology Code available for payment source

## 2011-11-13 ENCOUNTER — Inpatient Hospital Stay (HOSPITAL_COMMUNITY)
Admission: AD | Admit: 2011-11-13 | Discharge: 2011-11-14 | Disposition: A | Discharge status: Home / Self Care | Payer: No Typology Code available for payment source | Source: Ambulatory Visit | Attending: Obstetrics & Gynecology | Admitting: Obstetrics & Gynecology

## 2011-11-13 ENCOUNTER — Encounter (HOSPITAL_COMMUNITY): Payer: Self-pay | Admitting: *Deleted

## 2011-11-13 DIAGNOSIS — R109 Unspecified abdominal pain: Secondary | ICD-10-CM | POA: Insufficient documentation

## 2011-11-13 DIAGNOSIS — N760 Acute vaginitis: Secondary | ICD-10-CM | POA: Insufficient documentation

## 2011-11-13 DIAGNOSIS — B9689 Other specified bacterial agents as the cause of diseases classified elsewhere: Secondary | ICD-10-CM | POA: Insufficient documentation

## 2011-11-13 DIAGNOSIS — N949 Unspecified condition associated with female genital organs and menstrual cycle: Secondary | ICD-10-CM | POA: Insufficient documentation

## 2011-11-13 DIAGNOSIS — O99891 Other specified diseases and conditions complicating pregnancy: Secondary | ICD-10-CM | POA: Insufficient documentation

## 2011-11-13 DIAGNOSIS — A499 Bacterial infection, unspecified: Secondary | ICD-10-CM | POA: Insufficient documentation

## 2011-11-13 DIAGNOSIS — Z3490 Encounter for supervision of normal pregnancy, unspecified, unspecified trimester: Secondary | ICD-10-CM

## 2011-11-13 DIAGNOSIS — Z1389 Encounter for screening for other disorder: Secondary | ICD-10-CM

## 2011-11-13 LAB — URINALYSIS, ROUTINE W REFLEX MICROSCOPIC
Glucose, UA: NEGATIVE mg/dL
Leukocytes, UA: NEGATIVE
Specific Gravity, Urine: 1.015 (ref 1.005–1.030)
pH: 6.5 (ref 5.0–8.0)

## 2011-11-13 LAB — CBC
HCT: 35.7 % — ABNORMAL LOW (ref 36.0–46.0)
Hemoglobin: 11.9 g/dL — ABNORMAL LOW (ref 12.0–15.0)
MCV: 92.5 fL (ref 78.0–100.0)
RBC: 3.86 MIL/uL — ABNORMAL LOW (ref 3.87–5.11)
WBC: 9.4 10*3/uL (ref 4.0–10.5)

## 2011-11-13 LAB — POCT PREGNANCY, URINE: Preg Test, Ur: POSITIVE — AB

## 2011-11-13 LAB — WET PREP, GENITAL: Yeast Wet Prep HPF POC: NONE SEEN

## 2011-11-13 LAB — URINE MICROSCOPIC-ADD ON

## 2011-11-13 NOTE — Progress Notes (Signed)
S. SHore CNM in to see pt 

## 2011-11-13 NOTE — Progress Notes (Signed)
Sharp, shooting pain in LLQ which shoots into vagina. Denies bleeding or d/c. Pain started earlier today but constant since 1800

## 2011-11-13 NOTE — Progress Notes (Signed)
Pain shoots into vagina on occ

## 2011-11-13 NOTE — Progress Notes (Signed)
Pt brought into room 6 then sent to ultrasound.

## 2011-11-13 NOTE — ED Provider Notes (Signed)
Chief Complaint:  Abdominal Pain   Alexandra Davidson is  26 y.o. [id number].  Patient's last menstrual period was 10/23/2011.Marland Kitchen  Her pregnancy status is positive.  She presents complaining of Abdominal Pain . Onset is described as intermittent and has been present for  1 days. Described as sharp and shooting from left groin down to vagina. Denies blding or vaginal d/c. States slight odor for ~ 1 week.   Obstetrical/Gynecological History: OB History    Grav Para Term Preterm Abortions TAB SAB Ect Mult Living   5 2 1 1 2 1  1  2       Past Medical History: Past Medical History  Diagnosis Date  . Abnormal Pap smear     Past Surgical History: Past Surgical History  Procedure Date  . No past surgeries   . Laparoscopy 08/21/2011    Procedure: LAPAROSCOPY OPERATIVE;  Surgeon: Scheryl Darter, MD;  Location: [facility];  Service: Gynecology;  Laterality: N/A;  Operative Laparoscopy with Left Partial Salpingectomy  . Dilation and curettage of uterus     Family History: Family History  Problem Relation Age of Onset  . Anesthesia problems Neg Hx   . Hypotension Neg Hx   . Malignant hyperthermia Neg Hx   . Pseudochol deficiency Neg Hx   . Cancer Paternal Uncle   . Diabetes Maternal Grandmother     Social History: History  Substance Use Topics  . Smoking status: Never Smoker   . Smokeless tobacco: Not on file  . Alcohol Use: No    Allergies: No Known Allergies  Prescriptions prior to admission  Medication Sig Dispense Refill  . acetaminophen (TYLENOL) 325 MG tablet Take 325 mg by mouth every 6 (six) hours as needed. For pain       . Dextromethorphan-Guaifenesin (CHILDRENS COUGH PO) Take 5 mLs by mouth daily as needed. For cough       . prenatal vitamin w/FE, FA (PRENATAL 1 + 1) 27-1 MG TABS Take 1 tablet by mouth daily.          Review of Systems - Negative except what has been reviewed in the HPI  Physical Exam   Temperature 97.3 F (36.3 C), resp. rate 20, height 5' 7.5"  (1.715 m), weight 222 lb 3.2 oz (100.789 kg), last menstrual period 10/23/2011, not currently breastfeeding.  General: General appearance - alert, well appearing, and in no distress, oriented to person, place, and time and overweight Mental status - alert, oriented to person, place, and time, normal mood, behavior, speech, dress, motor activity, and thought processes, affect appropriate to mood Abdomen - soft, nontender, nondistended, no masses or organomegaly Focused Gynecological Exam: VULVA: normal appearing vulva with no masses, tenderness or lesions, VAGINA: vaginal discharge - white and malodorous, CERVIX: normal appearing cervix without discharge or lesions, UTERUS: enlarged to 6 week's size, non tender ADNEXA: normal adnexa in size, nontender and no masses  Labs: Recent Results (from the past 24 hour(s))  URINALYSIS, ROUTINE W REFLEX MICROSCOPIC   Collection Time   11/13/11  9:15 PM      Component Value Range   Color, Urine YELLOW  YELLOW    APPearance CLEAR  CLEAR    Specific Gravity, Urine 1.015  1.005 - 1.030    pH 6.5  5.0 - 8.0    Glucose, UA NEGATIVE  NEGATIVE (mg/dL)   Hgb urine dipstick SMALL (*) NEGATIVE    Bilirubin Urine NEGATIVE  NEGATIVE    Ketones, ur NEGATIVE  NEGATIVE (mg/dL)  Protein, ur NEGATIVE  NEGATIVE (mg/dL)   Urobilinogen, UA 1.0  0.0 - 1.0 (mg/dL)   Nitrite NEGATIVE  NEGATIVE    Leukocytes, UA NEGATIVE  NEGATIVE   URINE MICROSCOPIC-ADD ON   Collection Time   11/13/11  9:15 PM      Component Value Range   Squamous Epithelial / LPF RARE  RARE    WBC, UA 3-6  <3 (WBC/hpf)   RBC / HPF 7-10  <3 (RBC/hpf)   Urine-Other MUCOUS PRESENT    POCT PREGNANCY, URINE   Collection Time   11/13/11  9:22 PM      Component Value Range   Preg Test, Ur POSITIVE (*) NEGATIVE   CBC   Collection Time   11/13/11  9:40 PM      Component Value Range   WBC 9.4  4.0 - 10.5 (K/uL)   RBC 3.86 (*) 3.87 - 5.11 (MIL/uL)   Hemoglobin 11.9 (*) 12.0 - 15.0 (g/dL)   HCT 16.1 (*)  09.6 - 46.0 (%)   MCV 92.5  78.0 - 100.0 (fL)   MCH 30.8  26.0 - 34.0 (pg)   MCHC 33.3  30.0 - 36.0 (g/dL)   RDW 04.5  40.9 - 81.1 (%)   Platelets 243  150 - 400 (K/uL)  HCG, QUANTITATIVE, PREGNANCY   Collection Time   11/13/11  9:40 PM      Component Value Range   hCG, Beta Chain, Quant, S 7596 (*) <5 (mIU/mL)  ABO/RH   Collection Time   11/13/11  9:40 PM      Component Value Range   ABO/RH(D) O POS    WET PREP, GENITAL   Collection Time   11/13/11 11:20 PM      Component Value Range   Yeast Wet Prep HPF POC NONE SEEN  NONE SEEN    Trich, Wet Prep NONE SEEN  NONE SEEN    Clue Cells Wet Prep HPF POC MODERATE (*) NONE SEEN    WBC, Wet Prep HPF POC MANY (*) NONE SEEN    Imaging Studies:  *RADIOLOGY REPORT*  Clinical Data: Pelvic pain; recent history of ectopic pregnancy.  OBSTETRIC <14 WK [place] AND TRANSVAGINAL OB US  Technique: Both transabdominal and transvaginal ultrasound  examinations were performed for complete evaluation of the  gestation as well as the maternal uterus, adnexal regions, and  pelvic cul-de-sac. Transvaginal technique was performed to assess  early pregnancy.  Comparison: None.  Intrauterine gestational sac: Visualized/normal in shape.  Yolk sac: Yes  Embryo: No  Cardiac Activity: N/A  Heart Rate: N/A  MSD: 1.0 cm 5 w 5 d [place] EDC: 07/10/2012  Maternal uterus/adnexae:  No subchorionic hemorrhage is noted. The uterus is otherwise  unremarkable in appearance.  The ovaries are within normal limits. The right ovary measures 3.1  x 2.5 x 2.5 cm, while the left ovary measures 2.9 x 1.9 x 1.7 cm.  No suspicious adnexal masses are seen; there is no evidence for  ovarian torsion.  Trace free fluid is noted within the pelvic cul-de-sac, within  physiologic limits.  IMPRESSION:  Single intrauterine gestational sac noted, with a mean sac diameter  of 1.0 cm, corresponding to a gestational age of [redacted] weeks 5 days.  The embryo is not yet seen. This does not match the  gestational  age by LMP, and reflects a new estimated date of delivery of  July 10, 2012.  Original Report Authenticated By: Tonia Ghent, M.D.    Assessment: Early Pregnancy IUP  BV   Plan: Discharge home Rx tindamax sent to pharmacy Verification letter, MCD info, OB provider list given  Anushri Casalino E. 11/14/2011,12:13 AM

## 2011-11-14 ENCOUNTER — Encounter (HOSPITAL_COMMUNITY): Payer: Self-pay | Admitting: Physician Assistant

## 2011-11-14 LAB — GC/CHLAMYDIA PROBE AMP, GENITAL: Chlamydia, DNA Probe: NEGATIVE

## 2011-11-14 MED ORDER — TINIDAZOLE 500 MG PO TABS: 1.0000 g | ORAL_TABLET | Freq: Two times a day (BID) | ORAL | Status: AC

## 2011-11-14 MED ORDER — PRENATAL RX 60-1 MG PO TABS: 1.0000 | ORAL_TABLET | Freq: Every day | ORAL | Status: DC

## 2011-11-14 MED ORDER — ACETAMINOPHEN 325 MG PO TABS
650.0000 mg | ORAL_TABLET | Freq: Once | ORAL | Status: AC
  Administered 2011-11-14: 650 mg via ORAL
  Filled 2011-11-14: qty 2

## 2011-11-14 NOTE — Progress Notes (Signed)
Magda Kiel CNM earlier and d/c plan discussed. Written and verbal d/c instructions given and understanding voiced

## 2011-11-14 NOTE — Discharge Instructions (Signed)
Bacterial Vaginosis Bacterial vaginosis is an infection of the vagina. A healthy vagina has many kinds of good germs (bacteria). Sometimes the number of good germs can change. This allows bad germs to move in and cause an infection. You may be given medicine (antibiotics) to treat the infection. Or, you may not need treatment at all. HOME CARE  Take your medicine as told. Finish them even if you start to feel better.   Do not have sex until you finish your medicine.   Do not douche.   Practice safe sex.   Tell your sex partner that you have an infection. They should see their doctor for treatment if they have problems.  GET HELP RIGHT AWAY IF:  You do not get better after 3 days of treatment.   You have grey fluid (discharge) coming from your vagina.   You have pain.   You have a temperature of 102 F (38.9 C) or higher.  MAKE SURE YOU:   Understand these instructions.   Will watch your condition.   Will get help right away if you are not doing well or get worse.  Document Released: 06/23/2008 Document Revised: 05/27/2011 Document Reviewed: 06/23/2008 [facility] Patient Information 2012 [place], [place].Pregnancy If you are planning on getting pregnant, it is a good idea to make a preconception appointment with your care- giver to discuss having a healthy lifestyle before getting pregnant. Such as, diet, weight, exercise, taking prenatal vitamins especially folic acid (it helps prevent brain and spinal cord defects), avoiding alcohol, smoking and illegal drugs, medical problems (diabetes, convulsions), family history of genetic problems, working conditions and immunizations. It is better to have knowledge of these things and do something about them before getting pregnant. In your pregnancy, it is important to follow certain guidelines to have a healthy baby. It is very important to get good prenatal care and follow your caregiver's instructions. Prenatal care includes all the medical  care you receive before your baby's birth. This helps to prevent problems during the pregnancy and childbirth. HOME CARE INSTRUCTIONS   Start your prenatal visits by the 12th week of pregnancy or before when possible. They are usually scheduled monthly at first. They are more often in the last 2 months before delivery. It is important that you keep your caregiver's appointments and follow your caregiver's instructions regarding medication use, exercise, and diet.   During pregnancy, you are providing food for you and your baby. Eat a regular, well-balanced diet. Choose foods such as meat, fish, milk and other dairy products, vegetables, fruits, whole-grain breads and cereals. Your caregiver will inform you of the ideal weight gain depending on your current height and weight. Drink lots of liquids. Try to drink 8 glasses of water a day.   Alcohol is associated with a number of birth defects including fetal alcohol syndrome. It is best to avoid alcohol completely. Smoking will cause low birth rate and prematurity. Use of alcohol and nicotine during your pregnancy also increases the chances that your child will be chemically dependent later in their life and may contribute to SIDS (Sudden Infant Death Syndrome).   Do not use illegal drugs.   Only take prescription or over-the-counter medications that are recommended by your caregiver. Other medications can cause genetic and physical problems in the baby.   Morning sickness can often be helped by keeping soda crackers at the bedside. Eat a couple before arising in the morning.   A sexual relationship may be continued until near the end of  pregnancy if there are no other problems such as early (premature) leaking of amniotic fluid from the membranes, vaginal bleeding, painful intercourse or belly (abdominal) pain.   Exercise regularly. Check with your caregiver if you are unsure of the safety of some of your exercises.   Do not use hot tubs, steam  rooms or saunas. These increase the risk of fainting or passing out and hurting yourself and the baby. Swimming is OK for exercise. Get plenty of rest, including afternoon naps when possible especially in the third trimester.   Avoid toxic odors and chemicals.   Do not wear high heels. They may cause you to lose your balance and fall.   Do not lift over 5 pounds. If you do lift anything, lift with your legs and thighs, not your back.   Avoid long trips, especially in the third trimester.   If you have to travel out of the city or state, take a copy of your medical records with you.  SEEK IMMEDIATE MEDICAL CARE IF:   You develop an unexplained oral temperature above 102 F (38.9 C), or as your caregiver suggests.   You have leaking of fluid from the vagina. If leaking membranes are suspected, take your temperature and inform your caregiver of this when you call.   There is vaginal spotting or bleeding. Notify your caregiver of the amount and how many pads are used.   You continue to feel sick to your stomach (nauseous) and have no relief from remedies suggested, or you throw up (vomit) blood or coffee ground like materials.   You develop upper abdominal pain.   You have round ligament discomfort in the lower abdominal area. This still must be evaluated by your caregiver.   You feel contractions of the uterus.   You do not feel the baby move, or there is less movement than before.   You have painful urination.   You have abnormal vaginal discharge.   You have persistent diarrhea.   You get a severe headache.   You have problems with your vision.   You develop muscle weakness.   You feel dizzy and faint.   You develop shortness of breath.   You develop chest pain.   You have back pain that travels down to your leg and feet.   You feel irregular or a very fast heartbeat.   You develop excessive weight gain in a short period of time (5 pounds in 3 to 5 days).   You  are involved with a domestic violence situation.  Document Released: 09/14/2005 Document Revised: 05/27/2011 Document Reviewed: 03/08/2009 [employer] Patient Information 2012 [place]   ________________________________________     To schedule your Maternity Eligibility Appointment, please call [phone].  When you arrive for your appointment you must bring the following items or information listed below.  Your appointment will be rescheduled if you do not have these items or are 15 minutes late. If currently receiving Medicaid, you MUST bring: 1. Medicaid Card 2. Social Security Card 3. Picture ID 4. Proof of Pregnancy 5. Verification of current address if the address on Medicaid card is incorrect "postmarked mail" If not receiving Medicaid, you MUST bring: 1. Social Security Card 2. Picture ID 3. Birth Certificate (if available) Passport or *Green Card 4. Proof of Pregnancy 5. Verification of current address "postmarked mail" for each income presented. 6. Verification of insurance coverage, if any 7. Check stubs from each employer for the previous month (if unable to present check stub  for each week, we will accept check stub for the first and last week ill the same month.) If you can't locate check stubs, you must bring a letter from the employer(s) and it must have the following information on letterhead, typed, in English: o name of company o company telephone number o how long been with the company, if less than one month o how much person earns per hour o how many hours per week work o the gross pay the person earned for the previous month If you are 25 years old or less, you do not have to bring proof of income unless you work or live with the father of the baby and at that time we will need proof of income from you and/or the father of the baby. Green Card recipients are eligible for Medicaid for Pregnant Women (MPW)   Prenatal Care Providers [place]  [facility]    [facility]  [phone]     Phone: [phone]          [facility]                      [facility]  @[place]  [place]     Phone: [phone]  Phone: [phone]         [facility]     Phone: [phone]  Phone: [phone]           [facility] [place]                hone: [phone]  Phone: [phone]         [facility] Dr. Francoise Ceo      Phone: [phone]  Phone: [phone]         [facility] Dept.                Phone: [phone]  [facility]   Phone:[phone]    Family [address] [place])          Phone: [phone] [facility]   Phone: [phone] .

## 2011-11-15 NOTE — ED Provider Notes (Signed)
Medical Screening exam and patient care preformed by advanced practice provider.  Agree with the above management.  

## 2011-11-17 ENCOUNTER — Other Ambulatory Visit: Payer: Self-pay | Admitting: Advanced Practice Midwife

## 2011-11-17 ENCOUNTER — Inpatient Hospital Stay (HOSPITAL_COMMUNITY)
Admission: AD | Admit: 2011-11-17 | Discharge: 2011-11-17 | Disposition: A | Discharge status: Home / Self Care | Payer: No Typology Code available for payment source | Source: Ambulatory Visit | Attending: Obstetrics & Gynecology | Admitting: Obstetrics & Gynecology

## 2011-11-17 ENCOUNTER — Encounter (HOSPITAL_COMMUNITY): Payer: Self-pay | Admitting: *Deleted

## 2011-11-17 DIAGNOSIS — O210 Mild hyperemesis gravidarum: Secondary | ICD-10-CM | POA: Insufficient documentation

## 2011-11-17 LAB — URINALYSIS, ROUTINE W REFLEX MICROSCOPIC
Bilirubin Urine: NEGATIVE
Ketones, ur: 40 mg/dL — AB
Leukocytes, UA: NEGATIVE
Nitrite: NEGATIVE
Protein, ur: NEGATIVE mg/dL
pH: 6 (ref 5.0–8.0)

## 2011-11-17 MED ORDER — METOCLOPRAMIDE HCL 5 MG/ML IJ SOLN
10.0000 mg | Freq: Once | INTRAMUSCULAR | Status: AC
  Administered 2011-11-17: 10 mg via INTRAVENOUS
  Filled 2011-11-17: qty 2

## 2011-11-17 MED ORDER — ONDANSETRON HCL 4 MG PO TABS: 4.0000 mg | ORAL_TABLET | Freq: Three times a day (TID) | ORAL | Status: AC | PRN

## 2011-11-17 MED ORDER — METOCLOPRAMIDE HCL 10 MG PO TABS: 10.0000 mg | ORAL_TABLET | Freq: Four times a day (QID) | ORAL | Status: AC

## 2011-11-17 MED ORDER — ONDANSETRON HCL 4 MG/2ML IJ SOLN
4.0000 mg | Freq: Once | INTRAMUSCULAR | Status: AC
  Administered 2011-11-17: 4 mg via INTRAVENOUS
  Filled 2011-11-17 (×2): qty 2

## 2011-11-17 MED ORDER — DEXTROSE 5 % IN LACTATED RINGERS IV BOLUS
1000.0000 mL | Freq: Once | INTRAVENOUS | Status: AC
  Administered 2011-11-17: 1000 mL via INTRAVENOUS

## 2011-11-17 NOTE — ED Provider Notes (Signed)
History     Chief Complaint  Patient presents with  . Emesis During Pregnancy   HPI This is a 26 year old G5P1122 at 6 weeks and 1 day with 2 days of vomiting.  Eating and drinking fluid exacerbates nausea and vomiting.  Has not been able to keep anything down.  Denies sick contacts, fevers, chills, diarrhea, myalgias, arthralgias.  Did not have hyperemesis with other pregnancies.   OB History    Grav Para Term Preterm Abortions TAB SAB Ect Mult Living   5 2 1 1 2 1  1  2       Past Medical History  Diagnosis Date  . Abnormal Pap smear     Past Surgical History  Procedure Date  . No past surgeries   . Laparoscopy 08/21/2011    Procedure: LAPAROSCOPY OPERATIVE;  Surgeon: Scheryl Darter, MD;  Location: [facility];  Service: Gynecology;  Laterality: N/A;  Operative Laparoscopy with Left Partial Salpingectomy  . Dilation and curettage of uterus     Family History  Problem Relation Age of Onset  . Anesthesia problems Neg Hx   . Hypotension Neg Hx   . Malignant hyperthermia Neg Hx   . Pseudochol deficiency Neg Hx   . Cancer Paternal Uncle   . Diabetes Maternal Grandmother     History  Substance Use Topics  . Smoking status: Never Smoker   . Smokeless tobacco: Not on file  . Alcohol Use: No    Allergies: No Known Allergies  Prescriptions prior to admission  Medication Sig Dispense Refill  . acetaminophen (TYLENOL) 325 MG tablet Take 325 mg by mouth every 6 (six) hours as needed. For pain       . Prenatal Vit-Fe Fumarate-FA (PRENATAL MULTIVITAMIN) 60-1 MG tablet Take 1 tablet by mouth daily.  30 tablet  6  . tinidazole (TINDAMAX) 500 MG tablet Take 2 tablets (1,000 mg total) by mouth 2 (two) times daily.  8 tablet  0    Review of Systems  All other systems reviewed and are negative.   Physical Exam   Blood pressure 117/67, pulse 80, temperature 98 F (36.7 C), temperature source Oral, resp. rate 16, weight 98.703 kg (217 lb 9.6 oz), last menstrual period 10/23/2011,  SpO2 100.00%.  Physical Exam  Constitutional: She appears well-developed and well-nourished.  HENT:  Head: Normocephalic.  Cardiovascular: Normal rate, regular rhythm and normal heart sounds.   Respiratory: Effort normal and breath sounds normal. No respiratory distress. She has no wheezes. She has no rales. She exhibits no tenderness.  GI: Soft. Bowel sounds are normal. She exhibits no distension and no mass. There is no tenderness. There is no rebound and no guarding.  Skin: Skin is warm and dry.  Psychiatric: She has a normal mood and affect. Her behavior is normal. Judgment and thought content normal.    MAU Course  Procedures  MDM Patient's observe for approximately 3 hours. She was given 1 L of D5 lactated Ringer's and IV Zofran and Reglan with good improvement. Patient tolerated by mouth fluids  Assessment and Plan  #1 hyperemesis gravidarum  We'll send patient home with prescription for Zofran 4 mg by mouth. Patient followup with OB in 2-3 weeks or as needed.  Arthur Speagle JEHIEL 11/17/2011, 2:50 PM

## 2011-11-17 NOTE — Progress Notes (Signed)
Pt seen earlier today, given rx for Zofran, called MAU requesting less expensive rx. Rx sent for Reglan 10 mg qid PRN nausea/vomiting

## 2011-11-17 NOTE — Progress Notes (Signed)
Patient states she has had nonstop vomiting since 2-16. Unable to keep anything down. Throat hurts from the vomiting.

## 2011-11-17 NOTE — ED Notes (Signed)
Dr. Stinson at bedside.  

## 2011-11-17 NOTE — Discharge Instructions (Signed)
Hyperemesis Gravidarum Hyperemesis gravidarum is a severe form of nausea and vomiting that happens during pregnancy. Hyperemesis is worse than morning sickness. It may cause a woman to have nausea or vomiting all day for many days. It may keep a woman from eating and drinking enough food and liquids. Hyperemesis usually occurs during the first half (the first 20 weeks) of pregnancy. It often goes away once a woman is in her second half of pregnancy. However, sometimes hyperemesis continues through an entire pregnancy.  CAUSES  The cause of this condition is not completely known but is thought to be due to changes in the body's hormones when pregnant. It could be the high level of the pregnancy hormone or an increase in estrogen in the body.  SYMPTOMS   Severe nausea and vomiting.   Nausea that does not go away.   Vomiting that does not allow you to keep any food down.   Weight loss and body fluid loss (dehydration).   Having no desire to eat or not liking food you have previously enjoyed.  DIAGNOSIS  Your caregiver may ask you about your symptoms. Your caregiver may also order blood tests and urine tests to make sure something else is not causing the problem.  TREATMENT  You may only need medicine to control the problem. If medicines do not control the nausea and vomiting, you will be treated in the hospital to prevent dehydration, acidosis, weight loss, and changes in the electrolytes in your body that may harm the unborn baby (fetus). You may need intravenous (IV) fluids.  HOME CARE INSTRUCTIONS   Take all medicine as directed by your caregiver.   Try eating a couple of dry crackers or toast in the morning before getting out of bed.   Avoid foods and smells that upset your stomach.   Avoid fatty and spicy foods. Eat 5 to 6 small meals a day.   Do not drink when eating meals. Drink between meals.   For snacks, eat high protein foods, such as cheese. Eat or suck on things that have  ginger in them. Ginger helps nausea.   Avoid food preparation. The smell of food can spoil your appetite.   Avoid iron pills and iron in your multivitamins until after 3 to 4 months of being pregnant.  SEEK MEDICAL CARE IF:   Your abdominal pain increases since the last time you saw your caregiver.   You have a severe headache.   You develop vision problems.   You feel you are losing weight.  SEEK IMMEDIATE MEDICAL CARE IF:   You are unable to keep fluids down.   You vomit blood.   You have constant nausea and vomiting.   You have a fever.   You have excessive weakness, dizziness, fainting, or extreme thirst.  MAKE SURE YOU:   Understand these instructions.   Will watch your condition.   Will get help right away if you are not doing well or get worse.  Document Released: 09/14/2005 Document Revised: 05/27/2011 Document Reviewed: 12/15/2010 [employer] Patient Information 2012 [employer]. 

## 2011-12-03 ENCOUNTER — Encounter (HOSPITAL_COMMUNITY): Payer: Self-pay

## 2011-12-03 ENCOUNTER — Inpatient Hospital Stay (HOSPITAL_COMMUNITY)
Admission: AD | Admit: 2011-12-03 | Discharge: 2011-12-03 | Disposition: A | Discharge status: Home / Self Care | Payer: No Typology Code available for payment source | Source: Ambulatory Visit | Attending: Obstetrics & Gynecology | Admitting: Obstetrics & Gynecology

## 2011-12-03 DIAGNOSIS — O210 Mild hyperemesis gravidarum: Secondary | ICD-10-CM | POA: Insufficient documentation

## 2011-12-03 LAB — URINALYSIS, ROUTINE W REFLEX MICROSCOPIC
Bilirubin Urine: NEGATIVE
Hgb urine dipstick: NEGATIVE
Ketones, ur: >80 mg/dL — AB
Specific Gravity, Urine: >1.03 — ABNORMAL HIGH (ref 1.005–1.030)
Urobilinogen, UA: 2 mg/dL — ABNORMAL HIGH (ref 0.0–1.0)
pH: 6 (ref 5.0–8.0)

## 2011-12-03 LAB — CBC
Hemoglobin: 11.9 g/dL — ABNORMAL LOW (ref 12.0–15.0)
Platelets: 214 10*3/uL (ref 150–400)
RBC: 3.88 MIL/uL (ref 3.87–5.11)
WBC: 6.7 10*3/uL (ref 4.0–10.5)

## 2011-12-03 LAB — COMPREHENSIVE METABOLIC PANEL
ALT: 5 U/L (ref 0–35)
AST: 8 U/L (ref 0–37)
Alkaline Phosphatase: 36 U/L — ABNORMAL LOW (ref 39–117)
CO2: 24 mEq/L (ref 19–32)
Chloride: 98 mEq/L (ref 96–112)
GFR calc non Af Amer: >90 mL/min (ref >90)
Glucose, Bld: 82 mg/dL (ref 70–99)
Potassium: 3.4 mEq/L — ABNORMAL LOW (ref 3.5–5.1)
Sodium: 130 mEq/L — ABNORMAL LOW (ref 135–145)
Total Bilirubin: 0.7 mg/dL (ref 0.3–1.2)

## 2011-12-03 LAB — URINE MICROSCOPIC-ADD ON

## 2011-12-03 MED ORDER — LACTATED RINGERS IV BOLUS (SEPSIS)
1000.0000 mL | Freq: Once | INTRAVENOUS | Status: AC
  Administered 2011-12-03: 1000 mL via INTRAVENOUS

## 2011-12-03 MED ORDER — FAMOTIDINE IN NACL 20-0.9 MG/50ML-% IV SOLN
20.0000 mg | Freq: Once | INTRAVENOUS | Status: AC
  Administered 2011-12-03: 20 mg via INTRAVENOUS
  Filled 2011-12-03: qty 50

## 2011-12-03 MED ORDER — PROMETHAZINE HCL 25 MG/ML IJ SOLN
12.5000 mg | Freq: Once | INTRAMUSCULAR | Status: AC
  Administered 2011-12-03: 12.5 mg via INTRAVENOUS
  Filled 2011-12-03: qty 1

## 2011-12-03 MED ORDER — ONDANSETRON HCL 4 MG/2ML IJ SOLN
4.0000 mg | Freq: Once | INTRAMUSCULAR | Status: AC
  Administered 2011-12-03: 4 mg via INTRAVENOUS
  Filled 2011-12-03: qty 2

## 2011-12-03 MED ORDER — THIAMINE HCL 100 MG/ML IJ SOLN
Freq: Once | INTRAVENOUS | Status: AC
  Administered 2011-12-03: 19:00:00 via INTRAVENOUS
  Filled 2011-12-03: qty 1000

## 2011-12-03 MED ORDER — PROMETHAZINE HCL 25 MG PO TABS: 12.5000 mg | ORAL_TABLET | Freq: Four times a day (QID) | ORAL | Status: DC | PRN

## 2011-12-03 NOTE — Progress Notes (Signed)
Patient states she has had vomiting for 3 days. Medication for nausea is not working. Has some left lower abdominal pain with vomiting. No bleeding.

## 2011-12-03 NOTE — ED Provider Notes (Signed)
History     CSN: [id number]  Arrival date & time 12/03/11  1352   None     Chief Complaint  Patient presents with  . Emesis During Pregnancy   HPI: Alexandra Davidson is a 26 y.o. female @ [redacted]w[redacted]d gestation who presents to MAU for nausea and vomiting. Has had nausea and vomiting off and on since first got pregnant but the past 3 days has been worse. Complained of feeling weak and dizzy.   Past Medical History  Diagnosis Date  . Abnormal Pap smear   . Preterm labor     Past Surgical History  Procedure Date  . Laparoscopy 08/21/2011    Procedure: LAPAROSCOPY OPERATIVE;  Surgeon: Scheryl Darter, MD;  Location: [facility];  Service: Gynecology;  Laterality: N/A;  Operative Laparoscopy with Left Partial Salpingectomy  . Dilation and curettage of uterus     Family History  Problem Relation Age of Onset  . Anesthesia problems Neg Hx   . Hypotension Neg Hx   . Malignant hyperthermia Neg Hx   . Pseudochol deficiency Neg Hx   . Cancer Paternal Uncle   . Diabetes Maternal Grandmother     History  Substance Use Topics  . Smoking status: Never Smoker   . Smokeless tobacco: Never Used  . Alcohol Use: No    OB History    Grav Para Term Preterm Abortions TAB SAB Ect Mult Living   5 2 1 1 2 1  1  2       Review of Systems  Constitutional: Positive for appetite change. Negative for fever, chills, diaphoresis and fatigue.  HENT: Negative for ear pain, congestion, sore throat, facial swelling, neck pain, neck stiffness, dental problem and sinus pressure.   Eyes: Negative for photophobia, pain and discharge.  Respiratory: Negative for cough, chest tightness and wheezing.   Cardiovascular: Negative.   Gastrointestinal: Positive for nausea, vomiting and abdominal pain (epigastric with vomiting). Negative for diarrhea, constipation and abdominal distention.  Genitourinary: Negative for dysuria, frequency, flank pain, vaginal bleeding, vaginal discharge, difficulty urinating and pelvic pain.    Musculoskeletal: Negative for myalgias, back pain and gait problem.  Skin: Negative for color change and rash.  Neurological: Positive for light-headedness. Negative for dizziness, speech difficulty, weakness, numbness and headaches.  Psychiatric/Behavioral: Negative for confusion and agitation. The patient is not nervous/anxious.     Allergies  Review of patient's allergies indicates no known allergies.  Home Medications  No current outpatient prescriptions on file.  BP 118/69  Pulse 63  Temp(Src) 98.1 F (36.7 C) (Oral)  Resp 16  Ht 5\' 8"  (1.727 m)  Wt 210 lb 3.2 oz (95.346 kg)  BMI 31.96 kg/m2  SpO2 99%  LMP 10/23/2011  Breastfeeding? No  Physical Exam  Nursing note and vitals reviewed. Constitutional: She is oriented to person, place, and time. She appears well-developed and well-nourished.  HENT:  Head: Normocephalic.  Eyes: EOM are normal.  Neck: Neck supple.  Cardiovascular: Normal rate.   Pulmonary/Chest: Effort normal.  Abdominal: Soft. There is tenderness in the epigastric area. There is no rigidity, no rebound, no guarding and no CVA tenderness.  Musculoskeletal: Normal range of motion.  Neurological: She is alert and oriented to person, place, and time. No cranial nerve deficit.  Skin: Skin is warm and dry.  Psychiatric: She has a normal mood and affect. Her behavior is normal. Judgment and thought content normal.   Results for orders placed during the hospital encounter of 12/03/11 (from the past 24 hour(s))  URINALYSIS, ROUTINE W REFLEX MICROSCOPIC     Status: Abnormal   Collection Time   12/03/11  2:20 PM      Component Value Range   Color, Urine YELLOW  YELLOW    APPearance HAZY (*) CLEAR    Specific Gravity, Urine >1.030 (*) 1.005 - 1.030    pH 6.0  5.0 - 8.0    Glucose, UA NEGATIVE  NEGATIVE (mg/dL)   Hgb urine dipstick NEGATIVE  NEGATIVE    Bilirubin Urine NEGATIVE  NEGATIVE    Ketones, ur >80 (*) NEGATIVE (mg/dL)   Protein, ur NEGATIVE  NEGATIVE  (mg/dL)   Urobilinogen, UA 2.0 (*) 0.0 - 1.0 (mg/dL)   Nitrite NEGATIVE  NEGATIVE    Leukocytes, UA TRACE (*) NEGATIVE   URINE MICROSCOPIC-ADD ON     Status: Abnormal   Collection Time   12/03/11  2:20 PM      Component Value Range   Squamous Epithelial / LPF MANY (*) RARE    WBC, UA 3-6  <3 (WBC/hpf)   Bacteria, UA FEW (*) RARE    Urine-Other MUCOUS PRESENT    CBC     Status: Abnormal   Collection Time   12/03/11  4:20 PM      Component Value Range   WBC 6.7  4.0 - 10.5 (K/uL)   RBC 3.88  3.87 - 5.11 (MIL/uL)   Hemoglobin 11.9 (*) 12.0 - 15.0 (g/dL)   HCT 16.1 (*) 09.6 - 46.0 (%)   MCV 91.8  78.0 - 100.0 (fL)   MCH 30.7  26.0 - 34.0 (pg)   MCHC 33.4  30.0 - 36.0 (g/dL)   RDW 04.5  40.9 - 81.1 (%)   Platelets 214  150 - 400 (K/uL)  COMPREHENSIVE METABOLIC PANEL     Status: Abnormal   Collection Time   12/03/11  4:20 PM      Component Value Range   Sodium 130 (*) 135 - 145 (mEq/L)   Potassium 3.4 (*) 3.5 - 5.1 (mEq/L)   Chloride 98  96 - 112 (mEq/L)   CO2 24  19 - 32 (mEq/L)   Glucose, Bld 82  70 - 99 (mg/dL)   BUN 6  6 - 23 (mg/dL)   Creatinine, Ser 9.14  0.50 - 1.10 (mg/dL)   Calcium 8.9  8.4 - 78.2 (mg/dL)   Total Protein 6.6  6.0 - 8.3 (g/dL)   Albumin 3.4 (*) 3.5 - 5.2 (g/dL)   AST 8  0 - 37 (U/L)   ALT 5  0 - 35 (U/L)   Alkaline Phosphatase 36 (*) 39 - 117 (U/L)   Total Bilirubin 0.7  0.3 - 1.2 (mg/dL)   GFR calc non Af Amer >90  >90 (mL/min)   GFR calc Af Amer >90  >90 (mL/min)   Assessment: Nausea and vomiting in pregnancy  Plan:  IV hydration LR first bag then second bag of NS with MVI   Zofran IV 4 mg   Pepcid 20 mg IV  ED Course: patient continued to complain of nausea after zofran. Phenergan 12.5 mg IV given   Procedures  19:30 patient feeling much better, taking po fluids and eating crackers. Will d/c home with Rx for phenergan  MDM          Kerrie Buffalo, NP 12/03/11 2006

## 2011-12-03 NOTE — Discharge Instructions (Signed)
Hyperemesis Gravidarum Diet Hyperemesis gravidarum is a severe form of morning sickness. It is characterized by frequent and severe vomiting. It happens during the first trimester of pregnancy. It may be caused by the rapid hormone changes that happen during pregnancy. It is associated with a 5% weight loss of pre-pregnancy weight. The hyperemesis diet may be used to lessen symptoms of nausea and vomiting. EATING GUIDELINES  Eat 5 to 6 small meals daily instead of 3 large meals.   Avoid foods with strong smells.   Avoid drinking 30 minutes before and after meals.   Avoid fried or high-fat foods, such as butter and cream sauces.   Starchy foods are usually well-tolerated, such as cereal, toast, bread, potatoes, pasta, rice, and pretzels.   Eat crackers before you get out of bed in the morning.   Avoid spicy foods.   Ginger may help with nausea. Add  tsp ginger to hot tea or choose ginger tea.   Continue to take your prenatal vitamins as directed by your caregiver.  SAMPLE MEAL PLAN Breakfast    cup oatmeal   1 slice toast   1 tsp heart-healthy margarine   1 tsp jelly   1 scrambled egg  Midmorning Snack   1 cup low-fat yogurt  Lunch   Plain ham sandwich   Carrot or celery sticks   1 small apple   3 graham crackers  Midafternoon Snack   Cheese and crackers  Dinner  4 oz pork tenderloin   1 small baked potato   1 tsp margarine    cup broccoli    cup grapes  Evening Snack  1 cup pudding  Document Released: 07/12/2007 Document Revised: 09/03/2011 Document Reviewed: 01/09/2011 [employer] Patient Information 2012 [employer]. 

## 2011-12-03 NOTE — Progress Notes (Signed)
Pt states started vomiting a few days ago, ? Taking zofran from prior MAU visit. Denies diarrhea. LLQ pain, rates 7/10. Denies bleeding or abnormal vaginal d/c.

## 2011-12-14 ENCOUNTER — Encounter (HOSPITAL_COMMUNITY): Payer: Self-pay | Admitting: *Deleted

## 2011-12-14 ENCOUNTER — Inpatient Hospital Stay (HOSPITAL_COMMUNITY)
Admission: AD | Admit: 2011-12-14 | Discharge: 2011-12-14 | Disposition: A | Discharge status: Home / Self Care | Payer: No Typology Code available for payment source | Source: Ambulatory Visit | Attending: Obstetrics & Gynecology | Admitting: Obstetrics & Gynecology

## 2011-12-14 DIAGNOSIS — O210 Mild hyperemesis gravidarum: Secondary | ICD-10-CM | POA: Insufficient documentation

## 2011-12-14 DIAGNOSIS — O219 Vomiting of pregnancy, unspecified: Secondary | ICD-10-CM

## 2011-12-14 LAB — COMPREHENSIVE METABOLIC PANEL
AST: 9 U/L (ref 0–37)
BUN: 7 mg/dL (ref 6–23)
CO2: 23 mEq/L (ref 19–32)
Calcium: 9.4 mg/dL (ref 8.4–10.5)
Chloride: 100 mEq/L (ref 96–112)
Creatinine, Ser: 0.55 mg/dL (ref 0.50–1.10)
GFR calc Af Amer: >90 mL/min (ref >90)
GFR calc non Af Amer: >90 mL/min (ref >90)
Glucose, Bld: 86 mg/dL (ref 70–99)
Total Bilirubin: 0.9 mg/dL (ref 0.3–1.2)

## 2011-12-14 LAB — URINALYSIS, ROUTINE W REFLEX MICROSCOPIC
Hgb urine dipstick: NEGATIVE
Ketones, ur: >80 mg/dL — AB
Protein, ur: NEGATIVE mg/dL
Specific Gravity, Urine: 1.025 (ref 1.005–1.030)
Urobilinogen, UA: 2 mg/dL — ABNORMAL HIGH (ref 0.0–1.0)

## 2011-12-14 MED ORDER — FAMOTIDINE IN NACL 20-0.9 MG/50ML-% IV SOLN
20.0000 mg | Freq: Once | INTRAVENOUS | Status: AC
  Administered 2011-12-14: 20 mg via INTRAVENOUS
  Filled 2011-12-14: qty 50

## 2011-12-14 MED ORDER — FAMOTIDINE 20 MG PO TABS: 20.0000 mg | ORAL_TABLET | Freq: Two times a day (BID) | ORAL | Status: DC

## 2011-12-14 MED ORDER — PROMETHAZINE HCL 25 MG RE SUPP: 25.0000 mg | Freq: Four times a day (QID) | RECTAL | Status: DC | PRN

## 2011-12-14 MED ORDER — ONDANSETRON HCL 4 MG/2ML IJ SOLN
4.0000 mg | Freq: Once | INTRAMUSCULAR | Status: AC
  Administered 2011-12-14: 4 mg via INTRAVENOUS
  Filled 2011-12-14: qty 2

## 2011-12-14 MED ORDER — METOCLOPRAMIDE HCL 5 MG/ML IJ SOLN
10.0000 mg | Freq: Once | INTRAMUSCULAR | Status: AC
  Administered 2011-12-14: 10 mg via INTRAVENOUS
  Filled 2011-12-14: qty 2

## 2011-12-14 MED ORDER — ONDANSETRON 8 MG PO TBDP: 4.0000 mg | ORAL_TABLET | Freq: Three times a day (TID) | ORAL | Status: AC | PRN

## 2011-12-14 MED ORDER — DEXTROSE 5 % IN LACTATED RINGERS IV BOLUS
1000.0000 mL | Freq: Once | INTRAVENOUS | Status: AC
  Administered 2011-12-14: 1000 mL via INTRAVENOUS

## 2011-12-14 NOTE — MAU Note (Signed)
Patient states she has been unable to keep anything down for almost one week. Medications she has are not working any longer. No pain except with the vomiting, no bleeding.

## 2011-12-14 NOTE — MAU Provider Note (Signed)
History     CSN: [id number]  Arrival date and time: 12/14/11 0808   First Provider Initiated Contact with Patient 12/14/11 [phone]      Chief Complaint  Patient presents with  . Emesis During Pregnancy   HPI 26 y.o. [id number] at [redacted]w[redacted]d. N/V since [redacted] weeks EGA pregnancy, worse x 1 week, taking Zofran and Phenergan at home, having trouble keeping meds down, alternating one or the other q 6 hours. Unable to keep most food and liquids down. 5 lb weight loss since 3/7.    Past Medical History  Diagnosis Date  . Abnormal Pap smear   . Preterm labor   . Ectopic pregnancy     Past Surgical History  Procedure Date  . Laparoscopy 08/21/2011    Procedure: LAPAROSCOPY OPERATIVE;  Surgeon: Scheryl Darter, MD;  Location: [facility];  Service: Gynecology;  Laterality: N/A;  Operative Laparoscopy with Left Partial Salpingectomy  . Dilation and curettage of uterus     Family History  Problem Relation Age of Onset  . Anesthesia problems Neg Hx   . Hypotension Neg Hx   . Malignant hyperthermia Neg Hx   . Pseudochol deficiency Neg Hx   . Cancer Paternal Uncle   . Diabetes Maternal Grandmother     History  Substance Use Topics  . Smoking status: Never Smoker   . Smokeless tobacco: Never Used  . Alcohol Use: No    Allergies: No Known Allergies  Prescriptions prior to admission  Medication Sig Dispense Refill  . ondansetron (ZOFRAN) 4 MG tablet Take 4 mg by mouth every 8 (eight) hours as needed. For nausea      . Prenatal Vit-Fe Fumarate-FA (PRENATAL MULTIVITAMIN) 60-1 MG tablet Take 1 tablet by mouth daily.  30 tablet  6  . promethazine (PHENERGAN) 25 MG tablet Take 25 mg by mouth every 6 (six) hours as needed.        Review of Systems  Constitutional: Negative.   Respiratory: Negative.   Cardiovascular: Negative.   Gastrointestinal: Positive for nausea and vomiting. Negative for abdominal pain, diarrhea and constipation.  Genitourinary: Negative for dysuria, urgency, frequency, hematuria  and flank pain.       Negative for vaginal bleeding, vaginal discharge  Musculoskeletal: Negative.   Neurological: Negative.   Psychiatric/Behavioral: Negative.    Physical Exam   Blood pressure 111/72, pulse 75, temperature 97.5 F (36.4 C), temperature source Oral, resp. rate 16, height 5\' 8"  (1.727 m), weight 205 lb 6.4 oz (93.169 kg), last menstrual period 10/23/2011, SpO2 99.00%, not currently breastfeeding.  Physical Exam  Nursing note and vitals reviewed. Constitutional: She is oriented to person, place, and time. She appears well-developed and well-nourished. No distress.  Cardiovascular: Normal rate.   Respiratory: Effort normal.  GI: Soft. There is no tenderness.  Musculoskeletal: Normal range of motion.  Neurological: She is alert and oriented to person, place, and time.  Skin: Skin is warm and dry.  Psychiatric: She has a normal mood and affect.    MAU Course  Procedures  Results for orders placed during the hospital encounter of 12/14/11 (from the past 24 hour(s))  URINALYSIS, ROUTINE W REFLEX MICROSCOPIC     Status: Abnormal   Collection Time   12/14/11  8:23 AM      Component Value Range   Color, Urine AMBER (*) YELLOW    APPearance CLEAR  CLEAR    Specific Gravity, Urine 1.025  1.005 - 1.030    pH 6.5  5.0 - 8.0  Glucose, UA NEGATIVE  NEGATIVE (mg/dL)   Hgb urine dipstick NEGATIVE  NEGATIVE    Bilirubin Urine SMALL (*) NEGATIVE    Ketones, ur >80 (*) NEGATIVE (mg/dL)   Protein, ur NEGATIVE  NEGATIVE (mg/dL)   Urobilinogen, UA 2.0 (*) 0.0 - 1.0 (mg/dL)   Nitrite NEGATIVE  NEGATIVE    Leukocytes, UA NEGATIVE  NEGATIVE    Pt gave various stories regarding medications that she is taking to triage, RN and myself. When asked for clarification, pt states she's not really sure which medications she has been taking. She has been prescribed Zofran, Reglan and Phenergan.   IV hydration with D5LR initiated, Zofran 4 mg IV, pt states still feeling nauseous, also  reports some heartburn/reflux, will try anther Zofran 4mg , Reglan 10mg  and Pepcid 20 mg, then attempt sips of PO fluids.   Pt tolerating Gingerale following Zofran, Reglan and Pepcid. No vomiting during MAU visit.   Assessment and Plan  26 y.o. [id number] at [redacted]w[redacted]d with N/V in pregnancy Rx Zofran ODT 8mg  q 8 hours, Phenergan 25 mg supp q 6 hours, Pepcid 20 mg po bid, also has Reglan at home Discussed diet, recommended taking medications on schedule for now Follow up for prenatal care - patient requested referral to [facility] clinic, sent request to schedule appointment Return to MAU if vomiting is not well controlled  Alexandra Davidson 12/14/2011, 8:57 AM

## 2011-12-14 NOTE — MAU Note (Signed)
Pt states she has been taking Zofran, is no longer working.  States she has phenergan but has not taken it because it did not work with her last pregnancy.

## 2012-01-04 ENCOUNTER — Other Ambulatory Visit (HOSPITAL_COMMUNITY): Payer: Self-pay | Admitting: Nurse Practitioner

## 2012-01-04 DIAGNOSIS — Z331 Pregnant state, incidental: Secondary | ICD-10-CM

## 2012-01-04 LAB — GC/CHLAMYDIA PROBE AMP, GENITAL
Chlamydia: NEGATIVE
Gonorrhea: NEGATIVE

## 2012-01-04 LAB — OB RESULTS CONSOLE HEPATITIS B SURFACE ANTIGEN: Hepatitis B Surface Ag: NEGATIVE

## 2012-01-04 LAB — GLUCOSE, 1 HOUR
Cystic Fibrosis Profile: NEGATIVE
Glucose, 1 hour: 96

## 2012-01-04 LAB — CYTOLOGY - PAP: Pap: NEGATIVE

## 2012-01-04 LAB — OB RESULTS CONSOLE ABO/RH: RH Type: POSITIVE

## 2012-01-04 LAB — OB RESULTS CONSOLE HIV ANTIBODY (ROUTINE TESTING): HIV: NONREACTIVE

## 2012-01-04 LAB — VARICELLA ZOSTER ANTIBODY, IGG: Varicella: IMMUNE

## 2012-01-04 LAB — OB RESULTS CONSOLE RUBELLA ANTIBODY, IGM: Rubella: IMMUNE

## 2012-01-04 LAB — OB RESULTS CONSOLE ANTIBODY SCREEN: Antibody Screen: NEGATIVE

## 2012-01-04 LAB — CULTURE, OB URINE

## 2012-01-05 LAB — HEMOGLOBINOPATHY PROFILE: Hemoglobin, Elp: NORMAL

## 2012-01-06 ENCOUNTER — Ambulatory Visit (HOSPITAL_COMMUNITY)
Admission: RE | Admit: 2012-01-06 | Discharge: 2012-01-06 | Disposition: A | Discharge status: Home / Self Care | Payer: No Typology Code available for payment source | Source: Ambulatory Visit | Attending: Nurse Practitioner | Admitting: Nurse Practitioner

## 2012-01-06 ENCOUNTER — Other Ambulatory Visit (HOSPITAL_COMMUNITY): Payer: Self-pay | Admitting: Nurse Practitioner

## 2012-01-06 DIAGNOSIS — Z331 Pregnant state, incidental: Secondary | ICD-10-CM

## 2012-01-06 DIAGNOSIS — Z8751 Personal history of pre-term labor: Secondary | ICD-10-CM | POA: Insufficient documentation

## 2012-01-06 DIAGNOSIS — O09299 Supervision of pregnancy with other poor reproductive or obstetric history, unspecified trimester: Secondary | ICD-10-CM | POA: Insufficient documentation

## 2012-01-12 DIAGNOSIS — Z3480 Encounter for supervision of other normal pregnancy, unspecified trimester: Secondary | ICD-10-CM

## 2012-01-12 DIAGNOSIS — O09899 Supervision of other high risk pregnancies, unspecified trimester: Secondary | ICD-10-CM | POA: Insufficient documentation

## 2012-01-12 DIAGNOSIS — Z8759 Personal history of other complications of pregnancy, childbirth and the puerperium: Secondary | ICD-10-CM

## 2012-01-12 DIAGNOSIS — Z87898 Personal history of other specified conditions: Secondary | ICD-10-CM | POA: Insufficient documentation

## 2012-01-14 ENCOUNTER — Ambulatory Visit (INDEPENDENT_AMBULATORY_CARE_PROVIDER_SITE_OTHER): Payer: Self-pay | Admitting: Obstetrics and Gynecology

## 2012-01-14 ENCOUNTER — Encounter: Payer: Self-pay | Admitting: Obstetrics and Gynecology

## 2012-01-14 VITALS — BP 120/77 | Temp 99.1°F | Wt 210.5 lb

## 2012-01-14 DIAGNOSIS — Z8742 Personal history of other diseases of the female genital tract: Secondary | ICD-10-CM

## 2012-01-14 DIAGNOSIS — O009 Unspecified ectopic pregnancy without intrauterine pregnancy: Secondary | ICD-10-CM

## 2012-01-14 DIAGNOSIS — Z87898 Personal history of other specified conditions: Secondary | ICD-10-CM

## 2012-01-14 DIAGNOSIS — Z3480 Encounter for supervision of other normal pregnancy, unspecified trimester: Secondary | ICD-10-CM

## 2012-01-14 DIAGNOSIS — O09219 Supervision of pregnancy with history of pre-term labor, unspecified trimester: Secondary | ICD-10-CM

## 2012-01-14 LAB — POCT URINALYSIS DIP (DEVICE)
Bilirubin Urine: NEGATIVE
Glucose, UA: NEGATIVE mg/dL
Specific Gravity, Urine: 1.02 (ref 1.005–1.030)
Urobilinogen, UA: 0.2 mg/dL (ref 0.0–1.0)

## 2012-01-14 NOTE — Progress Notes (Signed)
Prenatal records from health department reviewed as well as records from 2006 delivery reviewed. No evidence of pre-eclampsia during first pregnancy as mentioned in HD records. Patient did not receive 17-P with second pregnancy and made it to term. Patient not interested in receiving 17-P for this pregnancy. Patient desires quad screen. Patient to meet with social worker and nutritionist today

## 2012-01-14 NOTE — Progress Notes (Signed)
Addended by: Catalina Antigua on: 01/14/2012 10:45 AM   Modules accepted: Orders

## 2012-01-14 NOTE — Progress Notes (Signed)
Pulse: 112. Vaginal d/c described as thin, white; no odor, no itch.

## 2012-01-14 NOTE — Progress Notes (Signed)
Nutrition Note:  (1st visit consult) Pt seen for hx of PTD and ectopic pregnancy. Pt has been hospitalized for hyperemesis resulting in slight wt loss.  Reports prepregnancy wt of 211#, lost down to 203# and now back up to 210#. Pt is feeling better, intake 3 meals with occasional snacks. No food allergies reported and vomining has subsided to qod.   Disc wt gain goal 11-20# and importance of a wide variety of foods.   Made pt a [facility]IC appt to correspond to next [facility] visit May 9.  Pt has missed a lot of work and request appts to be on same day.  Follow up 4-6 weeks. Cy Blamer, RD

## 2012-01-14 NOTE — Progress Notes (Signed)
U/S scheduled May 21,2013 at 730 am.

## 2012-01-14 NOTE — Patient Instructions (Signed)
Pregnancy - First Trimester  During sexual intercourse, millions of sperm go into the vagina. Only 1 sperm will penetrate and fertilize the female egg while it is in the Fallopian tube. One week later, the fertilized egg implants into the wall of the uterus. An embryo begins to develop into a baby. At 6 to 8 weeks, the eyes and face are formed and the heartbeat can be seen on ultrasound. At the end of 12 weeks (first trimester), all the baby's organs are formed. Now that you are pregnant, you will want to do everything you can to have a healthy baby. Two of the most important things are to get good prenatal care and follow your caregiver's instructions. Prenatal care is all the medical care you receive before the baby's birth. It is given to prevent, find, and treat problems during the pregnancy and childbirth.  PRENATAL EXAMS   During prenatal visits, your weight, blood pressure and urine are checked. This is done to make sure you are healthy and progressing normally during the pregnancy.   A pregnant woman should gain 25 to 35 pounds during the pregnancy. However, if you are over weight or underweight, your caregiver will advise you regarding your weight.   Your caregiver will ask and answer questions for you.   Blood work, cervical cultures, other necessary tests and a Pap test are done during your prenatal exams. These tests are done to check on your health and the probable health of your baby. Tests are strongly recommended and done for HIV with your permission. This is the virus that causes AIDS. These tests are done because medications can be given to help prevent your baby from being born with this infection should you have been infected without knowing it. Blood work is also used to find out your blood type, previous infections and follow your blood levels (hemoglobin).   Low hemoglobin (anemia) is common during pregnancy. Iron and vitamins are given to help prevent this. Later in the pregnancy, blood  tests for diabetes will be done along with any other tests if any problems develop. You may need tests to make sure you and the baby are doing well.   You may need other tests to make sure you and the baby are doing well.  CHANGES DURING THE FIRST TRIMESTER (THE FIRST 3 MONTHS OF PREGNANCY)  Your body goes through many changes during pregnancy. They vary from person to person. Talk to your caregiver about changes you notice and are concerned about. Changes can include:   Your menstrual period stops.   The egg and sperm carry the genes that determine what you look like. Genes from you and your partner are forming a baby. The female genes determine whether the baby is a boy or a girl.   Your body increases in girth and you may feel bloated.   Feeling sick to your stomach (nauseous) and throwing up (vomiting). If the vomiting is uncontrollable, call your caregiver.   Your breasts will begin to enlarge and become tender.   Your nipples may stick out more and become darker.   The need to urinate more. Painful urination may mean you have a bladder infection.   Tiring easily.   Loss of appetite.   Cravings for certain kinds of food.   At first, you may gain or lose a couple of pounds.   You may have changes in your emotions from day to day (excited to be pregnant or concerned something may go wrong with   the pregnancy and baby).   You may have more vivid and strange dreams.  HOME CARE INSTRUCTIONS    It is very important to avoid all smoking, alcohol and un-prescribed drugs during your pregnancy. These affect the formation and growth of the baby. Avoid chemicals while pregnant to ensure the delivery of a healthy infant.   Start your prenatal visits by the 12th week of pregnancy. They are usually scheduled monthly at first, then more often in the last 2 months before delivery. Keep your caregiver's appointments. Follow your caregiver's instructions regarding medication use, blood and lab tests, exercise, and  diet.   During pregnancy, you are providing food for you and your baby. Eat regular, well-balanced meals. Choose foods such as meat, fish, milk and other low fat dairy products, vegetables, fruits, and whole-grain breads and cereals. Your caregiver will tell you of the ideal weight gain.   You can help morning sickness by keeping soda crackers at the bedside. Eat a couple before arising in the morning. You may want to use the crackers without salt on them.   Eating 4 to 5 small meals rather than 3 large meals a day also may help the nausea and vomiting.   Drinking liquids between meals instead of during meals also seems to help nausea and vomiting.   A physical sexual relationship may be continued throughout pregnancy if there are no other problems. Problems may be early (premature) leaking of amniotic fluid from the membranes, vaginal bleeding, or belly (abdominal) pain.   Exercise regularly if there are no restrictions. Check with your caregiver or physical therapist if you are unsure of the safety of some of your exercises. Greater weight gain will occur in the last 2 trimesters of pregnancy. Exercising will help:   Control your weight.   Keep you in shape.   Prepare you for labor and delivery.   Help you lose your pregnancy weight after you deliver your baby.   Wear a good support or jogging bra for breast tenderness during pregnancy. This may help if worn during sleep too.   Ask when prenatal classes are available. Begin classes when they are offered.   Do not use hot tubs, steam rooms or saunas.   Wear your seat belt when driving. This protects you and your baby if you are in an accident.   Avoid raw meat, uncooked cheese, cat litter boxes and soil used by cats throughout the pregnancy. These carry germs that can cause birth defects in the baby.   The first trimester is a good time to visit your dentist for your dental health. Getting your teeth cleaned is OK. Use a softer toothbrush and brush  gently during pregnancy.   Ask for help if you have financial, counseling or nutritional needs during pregnancy. Your caregiver will be able to offer counseling for these needs as well as refer you for other special needs.   Do not take any medications or herbs unless told by your caregiver.   Inform your caregiver if there is any mental or physical domestic violence.   Make a list of emergency phone numbers of family, friends, hospital, and police and fire departments.   Write down your questions. Take them to your prenatal visit.   Do not douche.   Do not cross your legs.   If you have to stand for long periods of time, rotate you feet or take small steps in a circle.   You may have more vaginal secretions that may   require a sanitary pad. Do not use tampons or scented sanitary pads.  MEDICATIONS AND DRUG USE IN PREGNANCY   Take prenatal vitamins as directed. The vitamin should contain 1 milligram of folic acid. Keep all vitamins out of reach of children. Only a couple vitamins or tablets containing iron may be fatal to a baby or young child when ingested.   Avoid use of all medications, including herbs, over-the-counter medications, not prescribed or suggested by your caregiver. Only take over-the-counter or prescription medicines for pain, discomfort, or fever as directed by your caregiver. Do not use aspirin, ibuprofen, or naproxen unless directed by your caregiver.   Let your caregiver also know about herbs you may be using.   Alcohol is related to a number of birth defects. This includes fetal alcohol syndrome. All alcohol, in any form, should be avoided completely. Smoking will cause low birth rate and premature babies.   Street or illegal drugs are very harmful to the baby. They are absolutely forbidden. A baby born to an addicted mother will be addicted at birth. The baby will go through the same withdrawal an adult does.   Let your caregiver know about any medications that you have to take  and for what reason you take them.  MISCARRIAGE IS COMMON DURING PREGNANCY  A miscarriage does not mean you did something wrong. It is not a reason to worry about getting pregnant again. Your caregiver will help you with questions you may have. If you have a miscarriage, you may need minor surgery.  SEEK MEDICAL CARE IF:   You have any concerns or worries during your pregnancy. It is better to call with your questions if you feel they cannot wait, rather than worry about them.  SEEK IMMEDIATE MEDICAL CARE IF:    An unexplained oral temperature above 102 F (38.9 C) develops, or as your caregiver suggests.   You have leaking of fluid from the vagina (birth canal). If leaking membranes are suspected, take your temperature and inform your caregiver of this when you call.   There is vaginal spotting or bleeding. Notify your caregiver of the amount and how many pads are used.   You develop a bad smelling vaginal discharge with a change in the color.   You continue to feel sick to your stomach (nauseated) and have no relief from remedies suggested. You vomit blood or coffee ground-like materials.   You lose more than 2 pounds of weight in 1 week.   You gain more than 2 pounds of weight in 1 week and you notice swelling of your face, hands, feet, or legs.   You gain 5 pounds or more in 1 week (even if you do not have swelling of your hands, face, legs, or feet).   You get exposed to German measles and have never had them.   You are exposed to fifth disease or chickenpox.   You develop belly (abdominal) pain. Round ligament discomfort is a common non-cancerous (benign) cause of abdominal pain in pregnancy. Your caregiver still must evaluate this.   You develop headache, fever, diarrhea, pain with urination, or shortness of breath.   You fall or are in a car accident or have any kind of trauma.   There is mental or physical violence in your home.  Document Released: 09/08/2001 Document Revised: 09/03/2011  Document Reviewed: 03/12/2009  ExitCare Patient Information 2012 [employer].

## 2012-02-01 ENCOUNTER — Telehealth: Payer: Self-pay | Admitting: *Deleted

## 2012-02-01 NOTE — Telephone Encounter (Signed)
Alexandra Davidson called and left a message stating she wants to speak to someone - states " I  Have a situation going on and I really need to be written out of work for it- please call me

## 2012-02-02 ENCOUNTER — Encounter (HOSPITAL_COMMUNITY): Payer: Self-pay | Admitting: *Deleted

## 2012-02-02 ENCOUNTER — Inpatient Hospital Stay (HOSPITAL_COMMUNITY)
Admission: AD | Admit: 2012-02-02 | Discharge: 2012-02-02 | Disposition: A | Discharge status: Home / Self Care | Payer: Medicaid Other | Source: Ambulatory Visit | Attending: Family Medicine | Admitting: Family Medicine

## 2012-02-02 DIAGNOSIS — M549 Dorsalgia, unspecified: Secondary | ICD-10-CM | POA: Insufficient documentation

## 2012-02-02 DIAGNOSIS — M542 Cervicalgia: Secondary | ICD-10-CM | POA: Insufficient documentation

## 2012-02-02 DIAGNOSIS — Z3480 Encounter for supervision of other normal pregnancy, unspecified trimester: Secondary | ICD-10-CM

## 2012-02-02 DIAGNOSIS — F418 Other specified anxiety disorders: Secondary | ICD-10-CM

## 2012-02-02 DIAGNOSIS — O368190 Decreased fetal movements, unspecified trimester, not applicable or unspecified: Secondary | ICD-10-CM | POA: Insufficient documentation

## 2012-02-02 MED ORDER — HYDROXYZINE PAMOATE 25 MG PO CAPS: 25.0000 mg | ORAL_CAPSULE | Freq: Three times a day (TID) | ORAL | Status: AC | PRN

## 2012-02-02 MED ORDER — SERTRALINE HCL 50 MG PO TABS: 50.0000 mg | ORAL_TABLET | Freq: Every day | ORAL | Status: DC
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
## 2012-02-02 NOTE — Discharge Instructions (Signed)
Depression  You have signs of depression. This is a common problem. It can occur at any age. It is often hard to recognize. People can suffer from depression and still have moments of enjoyment. Depression interferes with your basic ability to function in life. It upsets your relationships, sleep, eating, and work habits.  CAUSES   Depression is believed to be caused by an imbalance in brain chemicals. It may be triggered by an unpleasant event. Relationship crises, a death in the family, financial worries, retirement, or other stressors are normal causes of depression. Depression may also start for no known reason. Other factors that may play a part include medical illnesses, some medicines, genetics, and alcohol or drug abuse.  SYMPTOMS    Feeling unhappy or worthless.   Long-lasting (chronic) tiredness or worn-out feeling.   Self-destructive thoughts and actions.   Not being able to sleep or sleeping too much.   Eating more than usual or not eating at all.   Headaches or feeling anxious.   Trouble concentrating or making decisions.   Unexplained physical problems and substance abuse.  TREATMENT   Depression usually gets better with treatment. This can include:   Antidepressant medicines. It can take weeks before the proper dose is achieved and benefits are reached.   Talking with a therapist, clergyperson, counselor, or friend. These people can help you gain insight into your problem and regain control of your life.   Eating a good diet.   Getting regular physical exercise, such as walking for 30 minutes every day.   Not abusing alcohol or drugs.  Treating depression often takes 6 months or longer. This length of treatment is needed to keep symptoms from returning. Call your caregiver and arrange for follow-up care as suggested.  SEEK IMMEDIATE MEDICAL CARE IF:    You start to have thoughts of hurting yourself or others.   Call your local emergency services (911 in [place]   Go to your local  medical emergency department.   Call the National Suicide Prevention Lifeline: 1-800-273-TALK (1-800-273-8255).  Document Released: 09/14/2005 Document Revised: 09/03/2011 Document Reviewed: 02/14/2010  ExitCare Patient Information 2012 [employer].
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
## 2012-02-03 NOTE — Telephone Encounter (Signed)
Spoke with patient advised her that she should discuss with provider at her visit tomorrow.

## 2012-02-03 NOTE — Telephone Encounter (Signed)
Called pt and left message to return our call to the clinics.  Pt has OB appt tomorrow 02/04/12.

## 2012-02-04 ENCOUNTER — Ambulatory Visit (INDEPENDENT_AMBULATORY_CARE_PROVIDER_SITE_OTHER): Payer: Self-pay | Admitting: Obstetrics & Gynecology

## 2012-02-04 VITALS — BP 136/77 | Temp 97.2°F | Wt 200.2 lb

## 2012-02-04 DIAGNOSIS — O09219 Supervision of pregnancy with history of pre-term labor, unspecified trimester: Secondary | ICD-10-CM

## 2012-02-04 DIAGNOSIS — Z3480 Encounter for supervision of other normal pregnancy, unspecified trimester: Secondary | ICD-10-CM

## 2012-02-04 LAB — POCT URINALYSIS DIP (DEVICE)
Glucose, UA: 100 mg/dL — AB
Ketones, ur: >=160 mg/dL — AB
Protein, ur: 30 mg/dL — AB
Urobilinogen, UA: 4 mg/dL — ABNORMAL HIGH (ref 0.0–1.0)
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
## 2012-02-04 NOTE — Progress Notes (Signed)
Pulse- 87 

## 2012-02-04 NOTE — Progress Notes (Signed)
Nutrition Note: (referral for poor wt gain)

## 2012-02-04 NOTE — Progress Notes (Signed)
Plans to not do 17P injections. Still with nausea and vomiting and reflux despite antinausea meds. 4+ ketones today  Start Pepcid, use phenergan rectally, return ! week

## 2012-02-04 NOTE — Patient Instructions (Signed)
Hyperemesis Gravidarum  Hyperemesis gravidarum is a severe form of nausea and vomiting that happens during pregnancy. Hyperemesis is worse than morning sickness. It may cause a woman to have nausea or vomiting all day for many days. It may keep a woman from eating and drinking enough food and liquids. Hyperemesis usually occurs during the first half (the first 20 weeks) of pregnancy. It often goes away once a woman is in her second half of pregnancy. However, sometimes hyperemesis continues through an entire pregnancy.   CAUSES   The cause of this condition is not completely known but is thought to be due to changes in the body's hormones when pregnant. It could be the high level of the pregnancy hormone or an increase in estrogen in the body.   SYMPTOMS    Severe nausea and vomiting.   Nausea that does not go away.   Vomiting that does not allow you to keep any food down.   Weight loss and body fluid loss (dehydration).   Having no desire to eat or not liking food you have previously enjoyed.  DIAGNOSIS   Your caregiver may ask you about your symptoms. Your caregiver may also order blood tests and urine tests to make sure something else is not causing the problem.   TREATMENT   You may only need medicine to control the problem. If medicines do not control the nausea and vomiting, you will be treated in the hospital to prevent dehydration, acidosis, weight loss, and changes in the electrolytes in your body that may harm the unborn baby (fetus). You may need intravenous (IV) fluids.   HOME CARE INSTRUCTIONS    Take all medicine as directed by your caregiver.   Try eating a couple of dry crackers or toast in the morning before getting out of bed.   Avoid foods and smells that upset your stomach.   Avoid fatty and spicy foods. Eat 5 to 6 small meals a day.   Do not drink when eating meals. Drink between meals.   For snacks, eat high protein foods, such as cheese. Eat or suck on things that have ginger in  them. Ginger helps nausea.   Avoid food preparation. The smell of food can spoil your appetite.   Avoid iron pills and iron in your multivitamins until after 3 to 4 months of being pregnant.  SEEK MEDICAL CARE IF:    Your abdominal pain increases since the last time you saw your caregiver.   You have a severe headache.   You develop vision problems.   You feel you are losing weight.  SEEK IMMEDIATE MEDICAL CARE IF:    You are unable to keep fluids down.   You vomit blood.   You have constant nausea and vomiting.   You have a fever.   You have excessive weakness, dizziness, fainting, or extreme thirst.  MAKE SURE YOU:    Understand these instructions.   Will watch your condition.   Will get help right away if you are not doing well or get worse.  Document Released: 09/14/2005 Document Revised: 09/03/2011 Document Reviewed: 12/15/2010  [employer] Patient Information 2012 [employer].

## 2012-02-07 NOTE — MAU Provider Note (Signed)
I have seen/examined this patient and agree with the student's assessment and plan. Marketa Midkiff E.  

## 2012-02-11 ENCOUNTER — Encounter: Payer: Self-pay | Admitting: Advanced Practice Midwife

## 2012-02-11 ENCOUNTER — Ambulatory Visit (INDEPENDENT_AMBULATORY_CARE_PROVIDER_SITE_OTHER): Payer: Self-pay | Admitting: Obstetrics and Gynecology

## 2012-02-11 VITALS — BP 115/71 | Temp 97.8°F | Wt 204.0 lb

## 2012-02-11 DIAGNOSIS — O09219 Supervision of pregnancy with history of pre-term labor, unspecified trimester: Secondary | ICD-10-CM

## 2012-02-11 DIAGNOSIS — Z3480 Encounter for supervision of other normal pregnancy, unspecified trimester: Secondary | ICD-10-CM

## 2012-02-11 LAB — POCT URINALYSIS DIP (DEVICE)
Bilirubin Urine: NEGATIVE
Glucose, UA: NEGATIVE mg/dL
Nitrite: NEGATIVE
Specific Gravity, Urine: 1.02 (ref 1.005–1.030)
Urobilinogen, UA: 1 mg/dL (ref 0.0–1.0)

## 2012-02-11 NOTE — Progress Notes (Signed)
Reviewed hx and doubt any PTD risk (IOL at ?35-36w,(BW was 7#, no problems) so will refer [facility]. Still N/V and missing work. Cont Phenergan and Zofran.  RLP discussed and do abd tightening exercises. Has ant [place] scheduled. Quad screen today.

## 2012-02-11 NOTE — Progress Notes (Signed)
Pulse: 85. Increasing pain in pelvic area that radiates down to inner thighs.

## 2012-02-11 NOTE — Patient Instructions (Signed)

## 2012-02-16 ENCOUNTER — Ambulatory Visit (HOSPITAL_COMMUNITY)
Admission: RE | Admit: 2012-02-16 | Discharge: 2012-02-16 | Disposition: A | Discharge status: Home / Self Care | Payer: Medicaid Other | Source: Ambulatory Visit | Attending: Obstetrics and Gynecology | Admitting: Obstetrics and Gynecology

## 2012-02-16 DIAGNOSIS — Z8751 Personal history of pre-term labor: Secondary | ICD-10-CM | POA: Insufficient documentation

## 2012-02-16 DIAGNOSIS — Z363 Encounter for antenatal screening for malformations: Secondary | ICD-10-CM | POA: Insufficient documentation

## 2012-02-16 DIAGNOSIS — Z3480 Encounter for supervision of other normal pregnancy, unspecified trimester: Secondary | ICD-10-CM

## 2012-02-16 DIAGNOSIS — O09299 Supervision of pregnancy with other poor reproductive or obstetric history, unspecified trimester: Secondary | ICD-10-CM | POA: Insufficient documentation

## 2012-02-16 DIAGNOSIS — O358XX0 Maternal care for other (suspected) fetal abnormality and damage, not applicable or unspecified: Secondary | ICD-10-CM | POA: Insufficient documentation

## 2012-02-16 DIAGNOSIS — Z1389 Encounter for screening for other disorder: Secondary | ICD-10-CM | POA: Insufficient documentation

## 2012-02-18 ENCOUNTER — Encounter: Payer: Self-pay | Admitting: Obstetrics and Gynecology

## 2012-02-18 ENCOUNTER — Ambulatory Visit (INDEPENDENT_AMBULATORY_CARE_PROVIDER_SITE_OTHER): Payer: Self-pay | Admitting: Obstetrics and Gynecology

## 2012-02-18 VITALS — BP 118/74 | Temp 97.2°F | Wt 210.8 lb

## 2012-02-18 DIAGNOSIS — Z3480 Encounter for supervision of other normal pregnancy, unspecified trimester: Secondary | ICD-10-CM

## 2012-02-18 LAB — POCT URINALYSIS DIP (DEVICE)
Bilirubin Urine: NEGATIVE
Glucose, UA: NEGATIVE mg/dL
Ketones, ur: NEGATIVE mg/dL
Nitrite: NEGATIVE

## 2012-02-18 NOTE — Progress Notes (Signed)
Patient doing well without complaints. Patient having a boy; information on circumcision fee provided.

## 2012-02-18 NOTE — Progress Notes (Signed)
Pulse- 88 Pain- pelvic and thighs

## 2012-02-19 ENCOUNTER — Encounter: Payer: Self-pay | Admitting: *Deleted

## 2012-02-24 ENCOUNTER — Telehealth: Payer: Self-pay | Admitting: *Deleted

## 2012-02-24 NOTE — Telephone Encounter (Signed)
Pt left message on 5/28 stating that she has been sick for 2 days and wants a work release note.

## 2012-02-24 NOTE — Telephone Encounter (Signed)
Called pt and left message that we are returning her call to please call the clinics.

## 2012-03-15 ENCOUNTER — Encounter: Payer: Self-pay | Admitting: Family Medicine

## 2012-03-16 ENCOUNTER — Encounter: Payer: Self-pay | Admitting: Advanced Practice Midwife

## 2012-03-30 ENCOUNTER — Encounter: Payer: Self-pay | Admitting: Physician Assistant

## 2012-04-06 ENCOUNTER — Ambulatory Visit (INDEPENDENT_AMBULATORY_CARE_PROVIDER_SITE_OTHER): Payer: Medicaid Other | Admitting: Advanced Practice Midwife

## 2012-04-06 VITALS — BP 129/73 | Temp 97.0°F | Wt 214.7 lb

## 2012-04-06 DIAGNOSIS — O09219 Supervision of pregnancy with history of pre-term labor, unspecified trimester: Secondary | ICD-10-CM

## 2012-04-06 LAB — CBC
MCH: 31.6 pg (ref 26.0–34.0)
Platelets: 207 10*3/uL (ref 150–400)
RBC: 3.64 MIL/uL — ABNORMAL LOW (ref 3.87–5.11)
RDW: 12.7 % (ref 11.5–15.5)
WBC: 10.4 10*3/uL (ref 4.0–10.5)

## 2012-04-06 LAB — HIV ANTIBODY (ROUTINE TESTING W REFLEX): HIV: NONREACTIVE

## 2012-04-06 LAB — POCT URINALYSIS DIP (DEVICE)
Glucose, UA: NEGATIVE mg/dL
Ketones, ur: NEGATIVE mg/dL
Specific Gravity, Urine: 1.02 (ref 1.005–1.030)
Urobilinogen, UA: 0.2 mg/dL (ref 0.0–1.0)

## 2012-04-06 NOTE — Progress Notes (Signed)
Pulse: 78

## 2012-04-06 NOTE — Progress Notes (Signed)
Seen also by me, agree with note 

## 2012-04-06 NOTE — Progress Notes (Signed)
+   movement, no ctx, pain, leaking, or spotting. Pt has no questions or concerns today. Thinks she will use IUD after delivery. Planning to both breast and bottle feed. Planning to get baby circumcised - has info already. Will obtain 1hr GTT, CBC, RPR, and HIV labs today.

## 2012-04-06 NOTE — Patient Instructions (Signed)
Pregnancy - Third Trimester The third trimester of pregnancy (the last 3 months) is a period of the most rapid growth for you and your baby. The baby approaches a length of 20 inches and a weight of 6 to 10 pounds. The baby is adding on fat and getting ready for life outside your body. While inside, babies have periods of sleeping and waking, suck their thumbs, and hiccups. You can often feel small contractions of the uterus. This is false labor. It is also called Braxton-Hicks contractions. This is like a practice for labor. The usual problems in this stage of pregnancy include more difficulty breathing, swelling of the hands and feet from water retention, and having to urinate more often because of the uterus and baby pressing on your bladder.  PRENATAL EXAMS  Blood work may continue to be done during prenatal exams. These tests are done to check on your health and the probable health of your baby. Blood work is used to follow your blood levels (hemoglobin). Anemia (low hemoglobin) is common during pregnancy. Iron and vitamins are given to help prevent this. You may also continue to be checked for diabetes. Some of the past blood tests may be done again.   The size of the uterus is measured during each visit. This makes sure your baby is growing properly according to your pregnancy dates.   Your blood pressure is checked every prenatal visit. This is to make sure you are not getting toxemia.   Your urine is checked every prenatal visit for infection, diabetes and protein.   Your weight is checked at each visit. This is done to make sure gains are happening at the suggested rate and that you and your baby are growing normally.   Sometimes, an ultrasound is performed to confirm the position and the proper growth and development of the baby. This is a test done that bounces harmless sound waves off the baby so your caregiver can more accurately determine due dates.   Discuss the type of pain  medication and anesthesia you will have during your labor and delivery.   Discuss the possibility and anesthesia if a Cesarean Section might be necessary.   Inform your caregiver if there is any mental or physical violence at home.  Sometimes, a specialized non-stress test, contraction stress test and biophysical profile are done to make sure the baby is not having a problem. Checking the amniotic fluid surrounding the baby is called an amniocentesis. The amniotic fluid is removed by sticking a needle into the belly (abdomen). This is sometimes done near the end of pregnancy if an early delivery is required. In this case, it is done to help make sure the baby's lungs are mature enough for the baby to live outside of the womb. If the lungs are not mature and it is unsafe to deliver the baby, an injection of cortisone medication is given to the mother 1 to 2 days before the delivery. This helps the baby's lungs mature and makes it safer to deliver the baby. CHANGES OCCURING IN THE THIRD TRIMESTER OF PREGNANCY Your body goes through many changes during pregnancy. They vary from person to person. Talk to your caregiver about changes you notice and are concerned about.  During the last trimester, you have probably had an increase in your appetite. It is normal to have cravings for certain foods. This varies from person to person and pregnancy to pregnancy.   You may begin to get stretch marks on your hips,   abdomen, and breasts. These are normal changes in the body during pregnancy. There are no exercises or medications to take which prevent this change.   Constipation may be treated with a stool softener or adding bulk to your diet. Drinking lots of fluids, fiber in vegetables, fruits, and whole grains are helpful.   Exercising is also helpful. If you have been very active up until your pregnancy, most of these activities can be continued during your pregnancy. If you have been less active, it is helpful  to start an exercise program such as walking. Consult your caregiver before starting exercise programs.   Avoid all smoking, alcohol, un-prescribed drugs, herbs and "street drugs" during your pregnancy. These chemicals affect the formation and growth of the baby. Avoid chemicals throughout the pregnancy to ensure the delivery of a healthy infant.   Backache, varicose veins and hemorrhoids may develop or get worse.   You will tire more easily in the third trimester, which is normal.   The baby's movements may be stronger and more often.   You may become short of breath easily.   Your belly button may stick out.   A yellow discharge may leak from your breasts called colostrum.   You may have a bloody mucus discharge. This usually occurs a few days to a week before labor begins.  HOME CARE INSTRUCTIONS   Keep your caregiver's appointments. Follow your caregiver's instructions regarding medication use, exercise, and diet.   During pregnancy, you are providing food for you and your baby. Continue to eat regular, well-balanced meals. Choose foods such as meat, fish, milk and other low fat dairy products, vegetables, fruits, and whole-grain breads and cereals. Your caregiver will tell you of the ideal weight gain.   A physical sexual relationship may be continued throughout pregnancy if there are no other problems such as early (premature) leaking of amniotic fluid from the membranes, vaginal bleeding, or belly (abdominal) pain.   Exercise regularly if there are no restrictions. Check with your caregiver if you are unsure of the safety of your exercises. Greater weight gain will occur in the last 2 trimesters of pregnancy. Exercising helps:   Control your weight.   Get you in shape for labor and delivery.   You lose weight after you deliver.   Rest a lot with legs elevated, or as needed for leg cramps or low back pain.   Wear a good support or jogging bra for breast tenderness during  pregnancy. This may help if worn during sleep. Pads or tissues may be used in the bra if you are leaking colostrum.   Do not use hot tubs, steam rooms, or saunas.   Wear your seat belt when driving. This protects you and your baby if you are in an accident.   Avoid raw meat, cat litter boxes and soil used by cats. These carry germs that can cause birth defects in the baby.   It is easier to loose urine during pregnancy. Tightening up and strengthening the pelvic muscles will help with this problem. You can practice stopping your urination while you are going to the bathroom. These are the same muscles you need to strengthen. It is also the muscles you would use if you were trying to stop from passing gas. You can practice tightening these muscles up 10 times a set and repeating this about 3 times per day. Once you know what muscles to tighten up, do not perform these exercises during urination. It is more likely   to cause an infection by backing up the urine.   Ask for help if you have financial, counseling or nutritional needs during pregnancy. Your caregiver will be able to offer counseling for these needs as well as refer you for other special needs.   Make a list of emergency phone numbers and have them available.   Plan on getting help from family or friends when you go home from the hospital.   Make a trial run to the hospital.   Take prenatal classes with the father to understand, practice and ask questions about the labor and delivery.   Prepare the baby's room/nursery.   Do not travel out of the city unless it is absolutely necessary and with the advice of your caregiver.   Wear only low or no heal shoes to have better balance and prevent falling.  MEDICATIONS AND DRUG USE IN PREGNANCY  Take prenatal vitamins as directed. The vitamin should contain 1 milligram of folic acid. Keep all vitamins out of reach of children. Only a couple vitamins or tablets containing iron may be fatal  to a baby or young child when ingested.   Avoid use of all medications, including herbs, over-the-counter medications, not prescribed or suggested by your caregiver. Only take over-the-counter or prescription medicines for pain, discomfort, or fever as directed by your caregiver. Do not use aspirin, ibuprofen (Motrin, Advil, Nuprin) or naproxen (Aleve) unless OK'd by your caregiver.   Let your caregiver also know about herbs you may be using.   Alcohol is related to a number of birth defects. This includes fetal alcohol syndrome. All alcohol, in any form, should be avoided completely. Smoking will cause low birth rate and premature babies.   Street/illegal drugs are very harmful to the baby. They are absolutely forbidden. A baby born to an addicted mother will be addicted at birth. The baby will go through the same withdrawal an adult does.  SEEK MEDICAL CARE IF: You have any concerns or worries during your pregnancy. It is better to call with your questions if you feel they cannot wait, rather than worry about them. DECISIONS ABOUT CIRCUMCISION You may or may not know the sex of your baby. If you know your baby is a boy, it may be time to think about circumcision. Circumcision is the removal of the foreskin of the penis. This is the skin that covers the sensitive end of the penis. There is no proven medical need for this. Often this decision is made on what is popular at the time or based upon religious beliefs and social issues. You can discuss these issues with your caregiver or pediatrician. SEEK IMMEDIATE MEDICAL CARE IF:   An unexplained oral temperature above 102 F (38.9 C) develops, or as your caregiver suggests.   You have leaking of fluid from the vagina (birth canal). If leaking membranes are suspected, take your temperature and tell your caregiver of this when you call.   There is vaginal spotting, bleeding or passing clots. Tell your caregiver of the amount and how many pads are  used.   You develop a bad smelling vaginal discharge with a change in the color from clear to white.   You develop vomiting that lasts more than 24 hours.   You develop chills or fever.   You develop shortness of breath.   You develop burning on urination.   You loose more than 2 pounds of weight or gain more than 2 pounds of weight or as suggested by your   caregiver.   You notice sudden swelling of your face, hands, and feet or legs.   You develop belly (abdominal) pain. Round ligament discomfort is a common non-cancerous (benign) cause of abdominal pain in pregnancy. Your caregiver still must evaluate you.   You develop a severe headache that does not go away.   You develop visual problems, blurred or double vision.   If you have not felt your baby move for more than 1 hour. If you think the baby is not moving as much as usual, eat something with sugar in it and lie down on your left side for an hour. The baby should move at least 4 to 5 times per hour. Call right away if your baby moves less than that.   You fall, are in a car accident or any kind of trauma.   There is mental or physical violence at home.  Document Released: 09/08/2001 Document Revised: 09/03/2011 Document Reviewed: 03/13/2009 [employer] Patient Information 2012 [employer]. 

## 2012-04-08 LAB — GLUCOSE TOLERANCE, 1 HOUR (50G) W/O FASTING: Glucose, 1 Hour GTT: 84 mg/dL (ref 70–140)

## 2012-04-20 ENCOUNTER — Ambulatory Visit (INDEPENDENT_AMBULATORY_CARE_PROVIDER_SITE_OTHER): Payer: Medicaid Other | Admitting: Physician Assistant

## 2012-04-20 ENCOUNTER — Encounter: Payer: Self-pay | Admitting: *Deleted

## 2012-04-20 VITALS — BP 104/67 | Temp 97.5°F | Wt 220.2 lb

## 2012-04-20 DIAGNOSIS — F329 Major depressive disorder, single episode, unspecified: Secondary | ICD-10-CM

## 2012-04-20 DIAGNOSIS — Z3480 Encounter for supervision of other normal pregnancy, unspecified trimester: Secondary | ICD-10-CM

## 2012-04-20 DIAGNOSIS — F32A Depression, unspecified: Secondary | ICD-10-CM | POA: Insufficient documentation

## 2012-04-20 DIAGNOSIS — O99340 Other mental disorders complicating pregnancy, unspecified trimester: Secondary | ICD-10-CM

## 2012-04-20 NOTE — Progress Notes (Signed)
Pulse: 93

## 2012-04-20 NOTE — Progress Notes (Signed)
No complaints. +FM daily. Anticipatory guidance. Reviewed safe meds for depression. Denies need now.

## 2012-04-20 NOTE — Patient Instructions (Signed)
Pregnancy - Third Trimester The third trimester of pregnancy (the last 3 months) is a period of the most rapid growth for you and your baby. The baby approaches a length of 20 inches and a weight of 6 to 10 pounds. The baby is adding on fat and getting ready for life outside your body. While inside, babies have periods of sleeping and waking, suck their thumbs, and hiccups. You can often feel small contractions of the uterus. This is false labor. It is also called Braxton-Hicks contractions. This is like a practice for labor. The usual problems in this stage of pregnancy include more difficulty breathing, swelling of the hands and feet from water retention, and having to urinate more often because of the uterus and baby pressing on your bladder.  PRENATAL EXAMS  Blood work may continue to be done during prenatal exams. These tests are done to check on your health and the probable health of your baby. Blood work is used to follow your blood levels (hemoglobin). Anemia (low hemoglobin) is common during pregnancy. Iron and vitamins are given to help prevent this. You may also continue to be checked for diabetes. Some of the past blood tests may be done again.   The size of the uterus is measured during each visit. This makes sure your baby is growing properly according to your pregnancy dates.   Your blood pressure is checked every prenatal visit. This is to make sure you are not getting toxemia.   Your urine is checked every prenatal visit for infection, diabetes and protein.   Your weight is checked at each visit. This is done to make sure gains are happening at the suggested rate and that you and your baby are growing normally.   Sometimes, an ultrasound is performed to confirm the position and the proper growth and development of the baby. This is a test done that bounces harmless sound waves off the baby so your caregiver can more accurately determine due dates.   Discuss the type of pain  medication and anesthesia you will have during your labor and delivery.   Discuss the possibility and anesthesia if a Cesarean Section might be necessary.   Inform your caregiver if there is any mental or physical violence at home.  Sometimes, a specialized non-stress test, contraction stress test and biophysical profile are done to make sure the baby is not having a problem. Checking the amniotic fluid surrounding the baby is called an amniocentesis. The amniotic fluid is removed by sticking a needle into the belly (abdomen). This is sometimes done near the end of pregnancy if an early delivery is required. In this case, it is done to help make sure the baby's lungs are mature enough for the baby to live outside of the womb. If the lungs are not mature and it is unsafe to deliver the baby, an injection of cortisone medication is given to the mother 1 to 2 days before the delivery. This helps the baby's lungs mature and makes it safer to deliver the baby. CHANGES OCCURING IN THE THIRD TRIMESTER OF PREGNANCY Your body goes through many changes during pregnancy. They vary from person to person. Talk to your caregiver about changes you notice and are concerned about.  During the last trimester, you have probably had an increase in your appetite. It is normal to have cravings for certain foods. This varies from person to person and pregnancy to pregnancy.   You may begin to get stretch marks on your hips,   abdomen, and breasts. These are normal changes in the body during pregnancy. There are no exercises or medications to take which prevent this change.   Constipation may be treated with a stool softener or adding bulk to your diet. Drinking lots of fluids, fiber in vegetables, fruits, and whole grains are helpful.   Exercising is also helpful. If you have been very active up until your pregnancy, most of these activities can be continued during your pregnancy. If you have been less active, it is helpful  to start an exercise program such as walking. Consult your caregiver before starting exercise programs.   Avoid all smoking, alcohol, un-prescribed drugs, herbs and "street drugs" during your pregnancy. These chemicals affect the formation and growth of the baby. Avoid chemicals throughout the pregnancy to ensure the delivery of a healthy infant.   Backache, varicose veins and hemorrhoids may develop or get worse.   You will tire more easily in the third trimester, which is normal.   The baby's movements may be stronger and more often.   You may become short of breath easily.   Your belly button may stick out.   A yellow discharge may leak from your breasts called colostrum.   You may have a bloody mucus discharge. This usually occurs a few days to a week before labor begins.  HOME CARE INSTRUCTIONS   Keep your caregiver's appointments. Follow your caregiver's instructions regarding medication use, exercise, and diet.   During pregnancy, you are providing food for you and your baby. Continue to eat regular, well-balanced meals. Choose foods such as meat, fish, milk and other low fat dairy products, vegetables, fruits, and whole-grain breads and cereals. Your caregiver will tell you of the ideal weight gain.   A physical sexual relationship may be continued throughout pregnancy if there are no other problems such as early (premature) leaking of amniotic fluid from the membranes, vaginal bleeding, or belly (abdominal) pain.   Exercise regularly if there are no restrictions. Check with your caregiver if you are unsure of the safety of your exercises. Greater weight gain will occur in the last 2 trimesters of pregnancy. Exercising helps:   Control your weight.   Get you in shape for labor and delivery.   You lose weight after you deliver.   Rest a lot with legs elevated, or as needed for leg cramps or low back pain.   Wear a good support or jogging bra for breast tenderness during  pregnancy. This may help if worn during sleep. Pads or tissues may be used in the bra if you are leaking colostrum.   Do not use hot tubs, steam rooms, or saunas.   Wear your seat belt when driving. This protects you and your baby if you are in an accident.   Avoid raw meat, cat litter boxes and soil used by cats. These carry germs that can cause birth defects in the baby.   It is easier to loose urine during pregnancy. Tightening up and strengthening the pelvic muscles will help with this problem. You can practice stopping your urination while you are going to the bathroom. These are the same muscles you need to strengthen. It is also the muscles you would use if you were trying to stop from passing gas. You can practice tightening these muscles up 10 times a set and repeating this about 3 times per day. Once you know what muscles to tighten up, do not perform these exercises during urination. It is more likely   to cause an infection by backing up the urine.   Ask for help if you have financial, counseling or nutritional needs during pregnancy. Your caregiver will be able to offer counseling for these needs as well as refer you for other special needs.   Make a list of emergency phone numbers and have them available.   Plan on getting help from family or friends when you go home from the hospital.   Make a trial run to the hospital.   Take prenatal classes with the father to understand, practice and ask questions about the labor and delivery.   Prepare the baby's room/nursery.   Do not travel out of the city unless it is absolutely necessary and with the advice of your caregiver.   Wear only low or no heal shoes to have better balance and prevent falling.  MEDICATIONS AND DRUG USE IN PREGNANCY  Take prenatal vitamins as directed. The vitamin should contain 1 milligram of folic acid. Keep all vitamins out of reach of children. Only a couple vitamins or tablets containing iron may be fatal  to a baby or young child when ingested.   Avoid use of all medications, including herbs, over-the-counter medications, not prescribed or suggested by your caregiver. Only take over-the-counter or prescription medicines for pain, discomfort, or fever as directed by your caregiver. Do not use aspirin, ibuprofen (Motrin, Advil, Nuprin) or naproxen (Aleve) unless OK'd by your caregiver.   Let your caregiver also know about herbs you may be using.   Alcohol is related to a number of birth defects. This includes fetal alcohol syndrome. All alcohol, in any form, should be avoided completely. Smoking will cause low birth rate and premature babies.   Street/illegal drugs are very harmful to the baby. They are absolutely forbidden. A baby born to an addicted mother will be addicted at birth. The baby will go through the same withdrawal an adult does.  SEEK MEDICAL CARE IF: You have any concerns or worries during your pregnancy. It is better to call with your questions if you feel they cannot wait, rather than worry about them. DECISIONS ABOUT CIRCUMCISION You may or may not know the sex of your baby. If you know your baby is a boy, it may be time to think about circumcision. Circumcision is the removal of the foreskin of the penis. This is the skin that covers the sensitive end of the penis. There is no proven medical need for this. Often this decision is made on what is popular at the time or based upon religious beliefs and social issues. You can discuss these issues with your caregiver or pediatrician. SEEK IMMEDIATE MEDICAL CARE IF:   An unexplained oral temperature above 102 F (38.9 C) develops, or as your caregiver suggests.   You have leaking of fluid from the vagina (birth canal). If leaking membranes are suspected, take your temperature and tell your caregiver of this when you call.   There is vaginal spotting, bleeding or passing clots. Tell your caregiver of the amount and how many pads are  used.   You develop a bad smelling vaginal discharge with a change in the color from clear to white.   You develop vomiting that lasts more than 24 hours.   You develop chills or fever.   You develop shortness of breath.   You develop burning on urination.   You loose more than 2 pounds of weight or gain more than 2 pounds of weight or as suggested by your   caregiver.   You notice sudden swelling of your face, hands, and feet or legs.   You develop belly (abdominal) pain. Round ligament discomfort is a common non-cancerous (benign) cause of abdominal pain in pregnancy. Your caregiver still must evaluate you.   You develop a severe headache that does not go away.   You develop visual problems, blurred or double vision.   If you have not felt your baby move for more than 1 hour. If you think the baby is not moving as much as usual, eat something with sugar in it and lie down on your left side for an hour. The baby should move at least 4 to 5 times per hour. Call right away if your baby moves less than that.   You fall, are in a car accident or any kind of trauma.   There is mental or physical violence at home.  Document Released: 09/08/2001 Document Revised: 09/03/2011 Document Reviewed: 03/13/2009 [employer] Patient Information 2012 [employer]. 

## 2012-04-27 ENCOUNTER — Encounter: Payer: Self-pay | Admitting: *Deleted

## 2012-04-27 DIAGNOSIS — O99340 Other mental disorders complicating pregnancy, unspecified trimester: Secondary | ICD-10-CM | POA: Insufficient documentation

## 2012-05-06 ENCOUNTER — Other Ambulatory Visit: Payer: Self-pay | Admitting: *Deleted

## 2012-05-06 MED ORDER — PROMETHAZINE HCL 25 MG PO TABS: ORAL_TABLET | ORAL | Status: DC

## 2012-05-06 NOTE — Telephone Encounter (Signed)
Called pt and informed her that her refill has been sent. Dosage instructions discussed. Pt voiced understanding.

## 2012-05-06 NOTE — Telephone Encounter (Signed)
Pt is requesting a refill on phenergan.

## 2012-05-11 ENCOUNTER — Ambulatory Visit (INDEPENDENT_AMBULATORY_CARE_PROVIDER_SITE_OTHER): Payer: Medicaid Other | Admitting: Family

## 2012-05-11 DIAGNOSIS — O09219 Supervision of pregnancy with history of pre-term labor, unspecified trimester: Secondary | ICD-10-CM

## 2012-05-11 LAB — POCT URINALYSIS DIP (DEVICE)
Glucose, UA: NEGATIVE mg/dL
Protein, ur: 30 mg/dL — AB
Specific Gravity, Urine: 1.025 (ref 1.005–1.030)
Urobilinogen, UA: 1 mg/dL (ref 0.0–1.0)

## 2012-05-11 NOTE — Progress Notes (Signed)
No questions or concerns; labor precautions.

## 2012-05-25 ENCOUNTER — Encounter: Payer: Self-pay | Admitting: Obstetrics and Gynecology

## 2012-05-25 ENCOUNTER — Ambulatory Visit (INDEPENDENT_AMBULATORY_CARE_PROVIDER_SITE_OTHER): Payer: Medicaid Other | Admitting: Obstetrics and Gynecology

## 2012-05-25 VITALS — BP 113/68 | Temp 96.6°F | Wt 224.3 lb

## 2012-05-25 DIAGNOSIS — F489 Nonpsychotic mental disorder, unspecified: Secondary | ICD-10-CM

## 2012-05-25 DIAGNOSIS — Z3480 Encounter for supervision of other normal pregnancy, unspecified trimester: Secondary | ICD-10-CM

## 2012-05-25 DIAGNOSIS — O99340 Other mental disorders complicating pregnancy, unspecified trimester: Secondary | ICD-10-CM

## 2012-05-25 LAB — POCT URINALYSIS DIP (DEVICE)
Bilirubin Urine: NEGATIVE
Glucose, UA: NEGATIVE mg/dL
Leukocytes, UA: NEGATIVE
Nitrite: NEGATIVE
pH: 7 (ref 5.0–8.0)

## 2012-05-25 NOTE — Progress Notes (Signed)
Pulse-  95 

## 2012-05-25 NOTE — Progress Notes (Signed)
Doing well; still working. RLP and plans discussed.

## 2012-05-25 NOTE — Patient Instructions (Signed)
Breastfeeding BENEFITS OF BREASTFEEDING For the baby  The first milk (colostrum) helps the baby's digestive system function better.   There are antibodies from the mother in the milk that help the baby fight off infections.   The baby has a lower incidence of asthma, allergies, and SIDS (sudden infant death syndrome).   The nutrients in breast milk are better than formulas for the baby and helps the baby's brain grow better.   Babies who breastfeed have less gas, colic, and constipation.  For the mother  Breastfeeding helps develop a very special bond between mother and baby.   It is more convenient, always available at the correct temperature and cheaper than formula feeding.   It burns calories in the mother and helps with losing weight that was gained during pregnancy.   It makes the uterus contract back down to normal size faster and slows bleeding following delivery.   Breastfeeding mothers have a lower risk of developing breast cancer.  NURSE FREQUENTLY  A healthy, full-term baby may breastfeed as often as every hour or space his or her feedings to every 3 hours.   How often to nurse will vary from baby to baby. Watch your baby for signs of hunger, not the clock.   Nurse as often as the baby requests, or when you feel the need to reduce the fullness of your breasts.   Awaken the baby if it has been 3 to 4 hours since the last feeding.   Frequent feeding will help the mother make more milk and will prevent problems like sore nipples and engorgement of the breasts.  BABY'S POSITION AT THE BREAST  Whether lying down or sitting, be sure that the baby's tummy is facing your tummy.   Support the breast with 4 fingers underneath the breast and the thumb above. Make sure your fingers are well away from the nipple and baby's mouth.   Stroke the baby's lips and cheek closest to the breast gently with your finger or nipple.   When the baby's mouth is open wide enough, place all  of your nipple and as much of the dark area around the nipple as possible into your baby's mouth.   Pull the baby in close so the tip of the nose and the baby's cheeks touch the breast during the feeding.  FEEDINGS  The length of each feeding varies from baby to baby and from feeding to feeding.   The baby must suck about 2 to 3 minutes for your milk to get to him or her. This is called a "let down." For this reason, allow the baby to feed on each breast as long as he or she wants. Your baby will end the feeding when he or she has received the right balance of nutrients.   To break the suction, put your finger into the corner of the baby's mouth and slide it between his or her gums before removing your breast from his or her mouth. This will help prevent sore nipples.  REDUCING BREAST ENGORGEMENT  In the first week after your baby is born, you may experience signs of breast engorgement. When breasts are engorged, they feel heavy, warm, full, and may be tender to the touch. You can reduce engorgement if you:   Nurse frequently, every 2 to 3 hours. Mothers who breastfeed early and often have fewer problems with engorgement.   Place light ice packs on your breasts between feedings. This reduces swelling. Wrap the ice packs in a   lightweight towel to protect your skin.   Apply moist hot packs to your breast for 5 to 10 minutes before each feeding. This increases circulation and helps the milk flow.   Gently massage your breast before and during the feeding.   Make sure that the baby empties at least one breast at every feeding before switching sides.   Use a breast pump to empty the breasts if your baby is sleepy or not nursing well. You may also want to pump if you are returning to work or or you feel you are getting engorged.   Avoid bottle feeds, pacifiers or supplemental feedings of water or juice in place of breastfeeding.   Be sure the baby is latched on and positioned properly while  breastfeeding.   Prevent fatigue, stress, and anemia.   Wear a supportive bra, avoiding underwire styles.   Eat a balanced diet with enough fluids.  If you follow these suggestions, your engorgement should improve in 24 to 48 hours. If you are still experiencing difficulty, call your lactation consultant or caregiver. IS MY BABY GETTING ENOUGH MILK? Sometimes, mothers worry about whether their babies are getting enough milk. You can be assured that your baby is getting enough milk if:  The baby is actively sucking and you hear swallowing.   The baby nurses at least 8 to 12 times in a 24 hour time period. Nurse your baby until he or she unlatches or falls asleep at the first breast (at least 10 to 20 minutes), then offer the second side.   The baby is wetting 5 to 6 disposable diapers (6 to 8 cloth diapers) in a 24 hour period by 5 to 6 days of age.   The baby is having at least 2 to 3 stools every 24 hours for the first few months. Breast milk is all the food your baby needs. It is not necessary for your baby to have water or formula. In fact, to help your breasts make more milk, it is best not to give your baby supplemental feedings during the early weeks.   The stool should be soft and yellow.   The baby should gain 4 to 7 ounces per week after he is 4 days old.  TAKE CARE OF YOURSELF Take care of your breasts by:  Bathing or showering daily.   Avoiding the use of soaps on your nipples.   Start feedings on your left breast at one feeding and on your right breast at the next feeding.   You will notice an increase in your milk supply 2 to 5 days after delivery. You may feel some discomfort from engorgement, which makes your breasts very firm and often tender. Engorgement "peaks" out within 24 to 48 hours. In the meantime, apply warm moist towels to your breasts for 5 to 10 minutes before feeding. Gentle massage and expression of some milk before feeding will soften your breasts, making  it easier for your baby to latch on. Wear a well fitting nursing bra and air dry your nipples for 10 to 15 minutes after each feeding.   Only use cotton bra pads.   Only use pure lanolin on your nipples after nursing. You do not need to wash it off before nursing.  Take care of yourself by:   Eating well-balanced meals and nutritious snacks.   Drinking milk, fruit juice, and water to satisfy your thirst (about 8 glasses a day).   Getting plenty of rest.   Increasing calcium in   your diet (1200 mg a day).   Avoiding foods that you notice affect the baby in a bad way.  SEEK MEDICAL CARE IF:   You have any questions or difficulty with breastfeeding.   You need help.   You have a hard, red, sore area on your breast, accompanied by a fever of 100.5 F (38.1 C) or more.   Your baby is too sleepy to eat well or is having trouble sleeping.   Your baby is wetting less than 6 diapers per day, by 5 days of age.   Your baby's skin or white part of his or her eyes is more yellow than it was in the hospital.   You feel depressed.  Document Released: 09/14/2005 Document Revised: 09/03/2011 Document Reviewed: 04/29/2009 [employer] Patient Information 2012 [employer]. 

## 2012-06-08 ENCOUNTER — Ambulatory Visit (INDEPENDENT_AMBULATORY_CARE_PROVIDER_SITE_OTHER): Payer: Medicaid Other | Admitting: Family Medicine

## 2012-06-08 VITALS — BP 112/78 | Temp 97.8°F | Wt 228.5 lb

## 2012-06-08 DIAGNOSIS — O09219 Supervision of pregnancy with history of pre-term labor, unspecified trimester: Secondary | ICD-10-CM

## 2012-06-08 DIAGNOSIS — O9989 Other specified diseases and conditions complicating pregnancy, childbirth and the puerperium: Secondary | ICD-10-CM

## 2012-06-08 DIAGNOSIS — N898 Other specified noninflammatory disorders of vagina: Secondary | ICD-10-CM

## 2012-06-08 DIAGNOSIS — Z3493 Encounter for supervision of normal pregnancy, unspecified, third trimester: Secondary | ICD-10-CM

## 2012-06-08 LAB — CBC
HCT: 32.8 % — ABNORMAL LOW (ref 36.0–46.0)
MCV: 91.4 fL (ref 78.0–100.0)
RBC: 3.59 MIL/uL — ABNORMAL LOW (ref 3.87–5.11)
WBC: 9.6 10*3/uL (ref 4.0–10.5)

## 2012-06-08 LAB — POCT URINALYSIS DIP (DEVICE)
Hgb urine dipstick: NEGATIVE
Protein, ur: NEGATIVE mg/dL
Specific Gravity, Urine: 1.02 (ref 1.005–1.030)
Urobilinogen, UA: 1 mg/dL (ref 0.0–1.0)

## 2012-06-08 LAB — OB RESULTS CONSOLE GBS: GBS: NEGATIVE

## 2012-06-08 NOTE — Progress Notes (Signed)
Pt doing well. Feels tired/sleepy, some shortness of breath with exertion. Braxton-Hicks contractions. GBS done today.

## 2012-06-08 NOTE — Progress Notes (Signed)
Pulse 88 Patient reports abdominal pain associated with "braxton-hicks"

## 2012-06-08 NOTE — Patient Instructions (Signed)
Braxton Hicks Contractions Pregnancy is commonly associated with contractions of the uterus throughout the pregnancy. Towards the end of pregnancy (32 to 34 weeks), these contractions (Braxton Hicks) can develop more often and may become more forceful. This is not true labor because these contractions do not result in opening (dilatation) and thinning of the cervix. They are sometimes difficult to tell apart from true labor because these contractions can be forceful and people have different pain tolerances. You should not feel embarrassed if you go to the hospital with false labor. Sometimes, the only way to tell if you are in true labor is for your caregiver to follow the changes in the cervix. How to tell the difference between true and false labor:  False labor.   The contractions of false labor are usually shorter, irregular and not as hard as those of true labor.   They are often felt in the front of the lower abdomen and in the groin.   They may leave with walking around or changing positions while lying down.   They get weaker and are shorter lasting as time goes on.   These contractions are usually irregular.   They do not usually become progressively stronger, regular and closer together as with true labor.   True labor.   Contractions in true labor last 30 to 70 seconds, become very regular, usually become more intense, and increase in frequency.   They do not go away with walking.   The discomfort is usually felt in the top of the uterus and spreads to the lower abdomen and low back.   True labor can be determined by your caregiver with an exam. This will show that the cervix is dilating and getting thinner.  If there are no prenatal problems or other health problems associated with the pregnancy, it is completely safe to be sent home with false labor and await the onset of true labor. HOME CARE INSTRUCTIONS   Keep up with your usual exercises and instructions.   Take  medications as directed.   Keep your regular prenatal appointment.   Eat and drink lightly if you think you are going into labor.   If BH contractions are making you uncomfortable:   Change your activity position from lying down or resting to walking/walking to resting.   Sit and rest in a tub of warm water.   Drink 2 to 3 glasses of water. Dehydration may cause B-H contractions.   Do slow and deep breathing several times an hour.  SEEK IMMEDIATE MEDICAL CARE IF:   Your contractions continue to become stronger, more regular, and closer together.   You have a gushing, burst or leaking of fluid from the vagina.   An oral temperature above 102 F (38.9 C) develops.   You have passage of blood-tinged mucus.   You develop vaginal bleeding.   You develop continuous belly (abdominal) pain.   You have low back pain that you never had before.   You feel the baby's head pushing down causing pelvic pressure.   The baby is not moving as much as it used to.  Document Released: 09/14/2005 Document Revised: 09/03/2011 Document Reviewed: 03/08/2009 [employer] Patient Information 2012 [employer]. Pregnancy - Third Trimester The third trimester of pregnancy (the last 3 months) is a period of the most rapid growth for you and your baby. The baby approaches a length of 20 inches and a weight of 6 to 10 pounds. The baby is adding on fat and getting   ready for life outside your body. While inside, babies have periods of sleeping and waking, suck their thumbs, and hiccups. You can often feel small contractions of the uterus. This is false labor. It is also called Braxton-Hicks contractions. This is like a practice for labor. The usual problems in this stage of pregnancy include more difficulty breathing, swelling of the hands and feet from water retention, and having to urinate more often because of the uterus and baby pressing on your bladder.  PRENATAL EXAMS  Blood work may continue to be done  during prenatal exams. These tests are done to check on your health and the probable health of your baby. Blood work is used to follow your blood levels (hemoglobin). Anemia (low hemoglobin) is common during pregnancy. Iron and vitamins are given to help prevent this. You may also continue to be checked for diabetes. Some of the past blood tests may be done again.   The size of the uterus is measured during each visit. This makes sure your baby is growing properly according to your pregnancy dates.   Your blood pressure is checked every prenatal visit. This is to make sure you are not getting toxemia.   Your urine is checked every prenatal visit for infection, diabetes and protein.   Your weight is checked at each visit. This is done to make sure gains are happening at the suggested rate and that you and your baby are growing normally.   Sometimes, an ultrasound is performed to confirm the position and the proper growth and development of the baby. This is a test done that bounces harmless sound waves off the baby so your caregiver can more accurately determine due dates.   Discuss the type of pain medication and anesthesia you will have during your labor and delivery.   Discuss the possibility and anesthesia if a Cesarean Section might be necessary.   Inform your caregiver if there is any mental or physical violence at home.  Sometimes, a specialized non-stress test, contraction stress test and biophysical profile are done to make sure the baby is not having a problem. Checking the amniotic fluid surrounding the baby is called an amniocentesis. The amniotic fluid is removed by sticking a needle into the belly (abdomen). This is sometimes done near the end of pregnancy if an early delivery is required. In this case, it is done to help make sure the baby's lungs are mature enough for the baby to live outside of the womb. If the lungs are not mature and it is unsafe to deliver the baby, an injection  of cortisone medication is given to the mother 1 to 2 days before the delivery. This helps the baby's lungs mature and makes it safer to deliver the baby. CHANGES OCCURING IN THE THIRD TRIMESTER OF PREGNANCY Your body goes through many changes during pregnancy. They vary from person to person. Talk to your caregiver about changes you notice and are concerned about.  During the last trimester, you have probably had an increase in your appetite. It is normal to have cravings for certain foods. This varies from person to person and pregnancy to pregnancy.   You may begin to get stretch marks on your hips, abdomen, and breasts. These are normal changes in the body during pregnancy. There are no exercises or medications to take which prevent this change.   Constipation may be treated with a stool softener or adding bulk to your diet. Drinking lots of fluids, fiber in vegetables, fruits, and   whole grains are helpful.   Exercising is also helpful. If you have been very active up until your pregnancy, most of these activities can be continued during your pregnancy. If you have been less active, it is helpful to start an exercise program such as walking. Consult your caregiver before starting exercise programs.   Avoid all smoking, alcohol, un-prescribed drugs, herbs and "street drugs" during your pregnancy. These chemicals affect the formation and growth of the baby. Avoid chemicals throughout the pregnancy to ensure the delivery of a healthy infant.   Backache, varicose veins and hemorrhoids may develop or get worse.   You will tire more easily in the third trimester, which is normal.   The baby's movements may be stronger and more often.   You may become short of breath easily.   Your belly button may stick out.   A yellow discharge may leak from your breasts called colostrum.   You may have a bloody mucus discharge. This usually occurs a few days to a week before labor begins.  HOME CARE  INSTRUCTIONS   Keep your caregiver's appointments. Follow your caregiver's instructions regarding medication use, exercise, and diet.   During pregnancy, you are providing food for you and your baby. Continue to eat regular, well-balanced meals. Choose foods such as meat, fish, milk and other low fat dairy products, vegetables, fruits, and whole-grain breads and cereals. Your caregiver will tell you of the ideal weight gain.   A physical sexual relationship may be continued throughout pregnancy if there are no other problems such as early (premature) leaking of amniotic fluid from the membranes, vaginal bleeding, or belly (abdominal) pain.   Exercise regularly if there are no restrictions. Check with your caregiver if you are unsure of the safety of your exercises. Greater weight gain will occur in the last 2 trimesters of pregnancy. Exercising helps:   Control your weight.   Get you in shape for labor and delivery.   You lose weight after you deliver.   Rest a lot with legs elevated, or as needed for leg cramps or low back pain.   Wear a good support or jogging bra for breast tenderness during pregnancy. This may help if worn during sleep. Pads or tissues may be used in the bra if you are leaking colostrum.   Do not use hot tubs, steam rooms, or saunas.   Wear your seat belt when driving. This protects you and your baby if you are in an accident.   Avoid raw meat, cat litter boxes and soil used by cats. These carry germs that can cause birth defects in the baby.   It is easier to loose urine during pregnancy. Tightening up and strengthening the pelvic muscles will help with this problem. You can practice stopping your urination while you are going to the bathroom. These are the same muscles you need to strengthen. It is also the muscles you would use if you were trying to stop from passing gas. You can practice tightening these muscles up 10 times a set and repeating this about 3 times per  day. Once you know what muscles to tighten up, do not perform these exercises during urination. It is more likely to cause an infection by backing up the urine.   Ask for help if you have financial, counseling or nutritional needs during pregnancy. Your caregiver will be able to offer counseling for these needs as well as refer you for other special needs.   Make a list   of emergency phone numbers and have them available.   Plan on getting help from family or friends when you go home from the hospital.   Make a trial run to the hospital.   Take prenatal classes with the father to understand, practice and ask questions about the labor and delivery.   Prepare the baby's room/nursery.   Do not travel out of the city unless it is absolutely necessary and with the advice of your caregiver.   Wear only low or no heal shoes to have better balance and prevent falling.  MEDICATIONS AND DRUG USE IN PREGNANCY  Take prenatal vitamins as directed. The vitamin should contain 1 milligram of folic acid. Keep all vitamins out of reach of children. Only a couple vitamins or tablets containing iron may be fatal to a baby or young child when ingested.   Avoid use of all medications, including herbs, over-the-counter medications, not prescribed or suggested by your caregiver. Only take over-the-counter or prescription medicines for pain, discomfort, or fever as directed by your caregiver. Do not use aspirin, ibuprofen (Motrin, Advil, Nuprin) or naproxen (Aleve) unless OK'd by your caregiver.   Let your caregiver also know about herbs you may be using.   Alcohol is related to a number of birth defects. This includes fetal alcohol syndrome. All alcohol, in any form, should be avoided completely. Smoking will cause low birth rate and premature babies.   Street/illegal drugs are very harmful to the baby. They are absolutely forbidden. A baby born to an addicted mother will be addicted at birth. The baby will go  through the same withdrawal an adult does.  SEEK MEDICAL CARE IF: You have any concerns or worries during your pregnancy. It is better to call with your questions if you feel they cannot wait, rather than worry about them. DECISIONS ABOUT CIRCUMCISION You may or may not know the sex of your baby. If you know your baby is a boy, it may be time to think about circumcision. Circumcision is the removal of the foreskin of the penis. This is the skin that covers the sensitive end of the penis. There is no proven medical need for this. Often this decision is made on what is popular at the time or based upon religious beliefs and social issues. You can discuss these issues with your caregiver or pediatrician. SEEK IMMEDIATE MEDICAL CARE IF:   An unexplained oral temperature above 102 F (38.9 C) develops, or as your caregiver suggests.   You have leaking of fluid from the vagina (birth canal). If leaking membranes are suspected, take your temperature and tell your caregiver of this when you call.   There is vaginal spotting, bleeding or passing clots. Tell your caregiver of the amount and how many pads are used.   You develop a bad smelling vaginal discharge with a change in the color from clear to white.   You develop vomiting that lasts more than 24 hours.   You develop chills or fever.   You develop shortness of breath.   You develop burning on urination.   You loose more than 2 pounds of weight or gain more than 2 pounds of weight or as suggested by your caregiver.   You notice sudden swelling of your face, hands, and feet or legs.   You develop belly (abdominal) pain. Round ligament discomfort is a common non-cancerous (benign) cause of abdominal pain in pregnancy. Your caregiver still must evaluate you.   You develop a severe headache that   does not go away.   You develop visual problems, blurred or double vision.   If you have not felt your baby move for more than 1 hour. If you think  the baby is not moving as much as usual, eat something with sugar in it and lie down on your left side for an hour. The baby should move at least 4 to 5 times per hour. Call right away if your baby moves less than that.   You fall, are in a car accident or any kind of trauma.   There is mental or physical violence at home.  Document Released: 09/08/2001 Document Revised: 09/03/2011 Document Reviewed: 03/13/2009 [employer] Patient Information 2012 [employer]. 

## 2012-06-09 LAB — WET PREP, GENITAL: Yeast Wet Prep HPF POC: NONE SEEN

## 2012-06-09 LAB — GC/CHLAMYDIA PROBE AMP, GENITAL: GC Probe Amp, Genital: NEGATIVE

## 2012-06-20 ENCOUNTER — Encounter (HOSPITAL_COMMUNITY): Payer: Self-pay | Admitting: *Deleted

## 2012-06-20 ENCOUNTER — Telehealth (HOSPITAL_COMMUNITY): Payer: Self-pay | Admitting: *Deleted

## 2012-06-20 NOTE — Telephone Encounter (Signed)
Preadmission screen  

## 2012-06-22 ENCOUNTER — Ambulatory Visit (INDEPENDENT_AMBULATORY_CARE_PROVIDER_SITE_OTHER): Payer: Medicaid Other | Admitting: Advanced Practice Midwife

## 2012-06-22 VITALS — BP 103/66 | Wt 232.2 lb

## 2012-06-22 DIAGNOSIS — O09219 Supervision of pregnancy with history of pre-term labor, unspecified trimester: Secondary | ICD-10-CM

## 2012-06-22 DIAGNOSIS — Z3480 Encounter for supervision of other normal pregnancy, unspecified trimester: Secondary | ICD-10-CM

## 2012-06-22 NOTE — Progress Notes (Signed)
Pulse: 83 Undecided on flu shot

## 2012-06-22 NOTE — Patient Instructions (Signed)

## 2012-06-22 NOTE — Progress Notes (Signed)
[facility] by informal [place]. Reviewed GBS neg.

## 2012-06-27 ENCOUNTER — Inpatient Hospital Stay (HOSPITAL_COMMUNITY)
Admission: AD | Admit: 2012-06-27 | Discharge: 2012-06-27 | Disposition: A | Discharge status: Home / Self Care | Payer: Medicaid Other | Source: Ambulatory Visit | Attending: Obstetrics & Gynecology | Admitting: Obstetrics & Gynecology

## 2012-06-27 DIAGNOSIS — O99340 Other mental disorders complicating pregnancy, unspecified trimester: Secondary | ICD-10-CM | POA: Insufficient documentation

## 2012-06-27 DIAGNOSIS — Z3480 Encounter for supervision of other normal pregnancy, unspecified trimester: Secondary | ICD-10-CM

## 2012-06-27 DIAGNOSIS — F489 Nonpsychotic mental disorder, unspecified: Secondary | ICD-10-CM | POA: Insufficient documentation

## 2012-06-27 DIAGNOSIS — O471 False labor at or after 37 completed weeks of gestation: Secondary | ICD-10-CM

## 2012-06-27 DIAGNOSIS — O479 False labor, unspecified: Secondary | ICD-10-CM | POA: Insufficient documentation

## 2012-06-27 NOTE — MAU Provider Note (Signed)
  History     CSN: [id number]  Arrival date and time: 06/27/12 5621   First Provider Initiated Contact with Patient 06/27/12 [phone]      Chief Complaint  Patient presents with  . Labor Eval   HPI  Alexandra Davidson is a 26 y.o. [id number] @ [redacted]w[redacted]d who presents today for labor check. She states that she has been having irregular contractions most of the night, and did not sleep very well last night. +FM, Denies LOF or VB.   Past Medical History  Diagnosis Date  . Preterm labor   . Ectopic pregnancy   . Abnormal Pap smear     lsil    Past Surgical History  Procedure Date  . Laparoscopy 08/21/2011    Procedure: LAPAROSCOPY OPERATIVE;  Surgeon: Scheryl Darter, MD;  Location: [facility];  Service: Gynecology;  Laterality: N/A;  Operative Laparoscopy with Left Partial Salpingectomy  . Dilation and curettage of uterus     Family History  Problem Relation Age of Onset  . Anesthesia problems Neg Hx   . Hypotension Neg Hx   . Malignant hyperthermia Neg Hx   . Pseudochol deficiency Neg Hx   . Cancer Paternal Uncle   . Diabetes Maternal Grandmother     History  Substance Use Topics  . Smoking status: Never Smoker   . Smokeless tobacco: Never Used  . Alcohol Use: No    Allergies: No Known Allergies  Prescriptions prior to admission  Medication Sig Dispense Refill  . acetaminophen-codeine (TYLENOL #3) 300-30 MG per tablet Take 1 tablet by mouth daily as needed. For tooth pain      . famotidine (PEPCID) 20 MG tablet Take 20 mg by mouth daily as needed. For reflux      . Prenatal Vit-Fe Fumarate-FA (PRENATAL MULTIVITAMIN) TABS Take 1 tablet by mouth daily.      . promethazine (PHENERGAN) 25 MG tablet Take 12.5-25 mg by mouth daily as needed. for nausea      . DISCONTD: promethazine (PHENERGAN) 25 MG tablet Take 1/2-1 tablet by mouth every 6hrs as needed for nausea  30 tablet  0    ROS   Physical Exam   Blood pressure 123/78, pulse 120, temperature 98.7 F (37.1 C), resp. rate  16, height 5\' 2"  (1.575 m), weight 105.235 kg (232 lb), last menstrual period 10/23/2011, SpO2 100.00%.  Physical Exam  Nursing note and vitals reviewed. Constitutional: She is oriented to person, place, and time. She appears well-developed and well-nourished.  Cardiovascular: Normal rate and regular rhythm.   Respiratory: Effort normal and breath sounds normal.  GI: Soft. Bowel sounds are normal.  Genitourinary:       External: normal Cervix: 1-2/60/-2/soft/Posterior Uterus: AGA  Neurological: She is alert and oriented to person, place, and time.     MAU Course  Procedures  08:50: Cervix: 1-2/60/-2  09:43: Cervix unchanged. Contractions have spaced out.   Assessment and Plan   1. 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 Supervision of other normal pregnancy   3. False labor after 37 weeks of gestation without delivery    Labor precautions given. Knows when to return.   Tawnya Crook 06/27/2012, 8:40 AM

## 2012-06-27 NOTE — MAU Note (Signed)
Patient states she has been having regular contractions and passed the mucus plug. Reports good fetal movement.

## 2012-06-27 NOTE — MAU Provider Note (Signed)
Attestation of Attending Supervision of Advanced Practitioner (CNM/NP): Evaluation and management procedures were performed by the Advanced Practitioner under my supervision and collaboration.  I have reviewed the Advanced Practitioner's note and chart, and I agree with the management and plan.  Meygan Kyser, MD, FACOG Attending Obstetrician & Gynecologist Faculty Practice, [facility]  

## 2012-06-29 ENCOUNTER — Ambulatory Visit (INDEPENDENT_AMBULATORY_CARE_PROVIDER_SITE_OTHER): Payer: Medicaid Other | Admitting: Obstetrics and Gynecology

## 2012-06-29 DIAGNOSIS — O09219 Supervision of pregnancy with history of pre-term labor, unspecified trimester: Secondary | ICD-10-CM

## 2012-06-29 NOTE — Patient Instructions (Signed)
Pregnancy - Third Trimester  The third trimester of pregnancy (the last 3 months) is a period of the most rapid growth for you and your baby. The baby approaches a length of 20 inches and a weight of 6 to 10 pounds. The baby is adding on fat and getting ready for life outside your body. While inside, babies have periods of sleeping and waking, suck their thumbs, and hiccups. You can often feel small contractions of the uterus. This is false labor. It is also called Braxton-Hicks contractions. This is like a practice for labor. The usual problems in this stage of pregnancy include more difficulty breathing, swelling of the hands and feet from water retention, and having to urinate more often because of the uterus and baby pressing on your bladder.   PRENATAL EXAMS  · Blood work may continue to be done during prenatal exams. These tests are done to check on your health and the probable health of your baby. Blood work is used to follow your blood levels (hemoglobin). Anemia (low hemoglobin) is common during pregnancy. Iron and vitamins are given to help prevent this. You may also continue to be checked for diabetes. Some of the past blood tests may be done again.  · The size of the uterus is measured during each visit. This makes sure your baby is growing properly according to your pregnancy dates.  · Your blood pressure is checked every prenatal visit. This is to make sure you are not getting toxemia.  · Your urine is checked every prenatal visit for infection, diabetes and protein.  · Your weight is checked at each visit. This is done to make sure gains are happening at the suggested rate and that you and your baby are growing normally.  · Sometimes, an ultrasound is performed to confirm the position and the proper growth and development of the baby. This is a test done that bounces harmless sound waves off the baby so your caregiver can more accurately determine due dates.  · Discuss the type of pain medication and  anesthesia you will have during your labor and delivery.  · Discuss the possibility and anesthesia if a Cesarean Section might be necessary.  · Inform your caregiver if there is any mental or physical violence at home.  Sometimes, a specialized non-stress test, contraction stress test and biophysical profile are done to make sure the baby is not having a problem. Checking the amniotic fluid surrounding the baby is called an amniocentesis. The amniotic fluid is removed by sticking a needle into the belly (abdomen). This is sometimes done near the end of pregnancy if an early delivery is required. In this case, it is done to help make sure the baby's lungs are mature enough for the baby to live outside of the womb. If the lungs are not mature and it is unsafe to deliver the baby, an injection of cortisone medication is given to the mother 1 to 2 days before the delivery. This helps the baby's lungs mature and makes it safer to deliver the baby.  CHANGES OCCURING IN THE THIRD TRIMESTER OF PREGNANCY  Your body goes through many changes during pregnancy. They vary from person to person. Talk to your caregiver about changes you notice and are concerned about.  · During the last trimester, you have probably had an increase in your appetite. It is normal to have cravings for certain foods. This varies from person to person and pregnancy to pregnancy.  · You may begin to   get stretch marks on your hips, abdomen, and breasts. These are normal changes in the body during pregnancy. There are no exercises or medications to take which prevent this change.  · Constipation may be treated with a stool softener or adding bulk to your diet. Drinking lots of fluids, fiber in vegetables, fruits, and whole grains are helpful.  · Exercising is also helpful. If you have been very active up until your pregnancy, most of these activities can be continued during your pregnancy. If you have been less active, it is helpful to start an exercise  program such as walking. Consult your caregiver before starting exercise programs.  · Avoid all smoking, alcohol, un-prescribed drugs, herbs and "street drugs" during your pregnancy. These chemicals affect the formation and growth of the baby. Avoid chemicals throughout the pregnancy to ensure the delivery of a healthy infant.  · Backache, varicose veins and hemorrhoids may develop or get worse.  · You will tire more easily in the third trimester, which is normal.  · The baby's movements may be stronger and more often.  · You may become short of breath easily.  · Your belly button may stick out.  · A yellow discharge may leak from your breasts called colostrum.  · You may have a bloody mucus discharge. This usually occurs a few days to a week before labor begins.  HOME CARE INSTRUCTIONS   · Keep your caregiver's appointments. Follow your caregiver's instructions regarding medication use, exercise, and diet.  · During pregnancy, you are providing food for you and your baby. Continue to eat regular, well-balanced meals. Choose foods such as meat, fish, milk and other low fat dairy products, vegetables, fruits, and whole-grain breads and cereals. Your caregiver will tell you of the ideal weight gain.  · A physical sexual relationship may be continued throughout pregnancy if there are no other problems such as early (premature) leaking of amniotic fluid from the membranes, vaginal bleeding, or belly (abdominal) pain.  · Exercise regularly if there are no restrictions. Check with your caregiver if you are unsure of the safety of your exercises. Greater weight gain will occur in the last 2 trimesters of pregnancy. Exercising helps:  · Control your weight.  · Get you in shape for labor and delivery.  · You lose weight after you deliver.  · Rest a lot with legs elevated, or as needed for leg cramps or low back pain.  · Wear a good support or jogging bra for breast tenderness during pregnancy. This may help if worn during  sleep. Pads or tissues may be used in the bra if you are leaking colostrum.  · Do not use hot tubs, steam rooms, or saunas.  · Wear your seat belt when driving. This protects you and your baby if you are in an accident.  · Avoid raw meat, cat litter boxes and soil used by cats. These carry germs that can cause birth defects in the baby.  · It is easier to loose urine during pregnancy. Tightening up and strengthening the pelvic muscles will help with this problem. You can practice stopping your urination while you are going to the bathroom. These are the same muscles you need to strengthen. It is also the muscles you would use if you were trying to stop from passing gas. You can practice tightening these muscles up 10 times a set and repeating this about 3 times per day. Once you know what muscles to tighten up, do not perform these   exercises during urination. It is more likely to cause an infection by backing up the urine.  · Ask for help if you have financial, counseling or nutritional needs during pregnancy. Your caregiver will be able to offer counseling for these needs as well as refer you for other special needs.  · Make a list of emergency phone numbers and have them available.  · Plan on getting help from family or friends when you go home from the hospital.  · Make a trial run to the hospital.  · Take prenatal classes with the father to understand, practice and ask questions about the labor and delivery.  · Prepare the baby's room/nursery.  · Do not travel out of the city unless it is absolutely necessary and with the advice of your caregiver.  · Wear only low or no heal shoes to have better balance and prevent falling.  MEDICATIONS AND DRUG USE IN PREGNANCY  · Take prenatal vitamins as directed. The vitamin should contain 1 milligram of folic acid. Keep all vitamins out of reach of children. Only a couple vitamins or tablets containing iron may be fatal to a baby or young child when ingested.  · Avoid use  of all medications, including herbs, over-the-counter medications, not prescribed or suggested by your caregiver. Only take over-the-counter or prescription medicines for pain, discomfort, or fever as directed by your caregiver. Do not use aspirin, ibuprofen (Motrin®, Advil®, Nuprin®) or naproxen (Aleve®) unless OK'd by your caregiver.  · Let your caregiver also know about herbs you may be using.  · Alcohol is related to a number of birth defects. This includes fetal alcohol syndrome. All alcohol, in any form, should be avoided completely. Smoking will cause low birth rate and premature babies.  · Street/illegal drugs are very harmful to the baby. They are absolutely forbidden. A baby born to an addicted mother will be addicted at birth. The baby will go through the same withdrawal an adult does.  SEEK MEDICAL CARE IF:  You have any concerns or worries during your pregnancy. It is better to call with your questions if you feel they cannot wait, rather than worry about them.  DECISIONS ABOUT CIRCUMCISION  You may or may not know the sex of your baby. If you know your baby is a boy, it may be time to think about circumcision. Circumcision is the removal of the foreskin of the penis. This is the skin that covers the sensitive end of the penis. There is no proven medical need for this. Often this decision is made on what is popular at the time or based upon religious beliefs and social issues. You can discuss these issues with your caregiver or pediatrician.  SEEK IMMEDIATE MEDICAL CARE IF:   · An unexplained oral temperature above 102° F (38.9° C) develops, or as your caregiver suggests.  · You have leaking of fluid from the vagina (birth canal). If leaking membranes are suspected, take your temperature and tell your caregiver of this when you call.  · There is vaginal spotting, bleeding or passing clots. Tell your caregiver of the amount and how many pads are used.  · You develop a bad smelling vaginal discharge with  a change in the color from clear to white.  · You develop vomiting that lasts more than 24 hours.  · You develop chills or fever.  · You develop shortness of breath.  · You develop burning on urination.  · You loose more than 2 pounds of weight   or gain more than 2 pounds of weight or as suggested by your caregiver.  · You notice sudden swelling of your face, hands, and feet or legs.  · You develop belly (abdominal) pain. Round ligament discomfort is a common non-cancerous (benign) cause of abdominal pain in pregnancy. Your caregiver still must evaluate you.  · You develop a severe headache that does not go away.  · You develop visual problems, blurred or double vision.  · If you have not felt your baby move for more than 1 hour. If you think the baby is not moving as much as usual, eat something with sugar in it and lie down on your left side for an hour. The baby should move at least 4 to 5 times per hour. Call right away if your baby moves less than that.  · You fall, are in a car accident or any kind of trauma.  · There is mental or physical violence at home.  Document Released: 09/08/2001 Document Revised: 12/07/2011 Document Reviewed: 03/13/2009  [employer]® Patient Information ©2013 [employer].

## 2012-06-29 NOTE — Progress Notes (Signed)
Pulse:106 Doesn't want flu shot today.

## 2012-06-29 NOTE — Progress Notes (Signed)
Doing well. Plans Mirena.

## 2012-07-06 ENCOUNTER — Encounter: Payer: Self-pay | Admitting: Advanced Practice Midwife

## 2012-07-06 ENCOUNTER — Encounter: Payer: Medicaid Other | Admitting: Advanced Practice Midwife

## 2012-07-06 ENCOUNTER — Ambulatory Visit (INDEPENDENT_AMBULATORY_CARE_PROVIDER_SITE_OTHER): Payer: Medicaid Other | Admitting: Advanced Practice Midwife

## 2012-07-06 DIAGNOSIS — O09219 Supervision of pregnancy with history of pre-term labor, unspecified trimester: Secondary | ICD-10-CM

## 2012-07-06 LAB — POCT URINALYSIS DIP (DEVICE)
Protein, ur: NEGATIVE mg/dL
Specific Gravity, Urine: 1.02 (ref 1.005–1.030)
Urobilinogen, UA: 1 mg/dL (ref 0.0–1.0)

## 2012-07-06 NOTE — Progress Notes (Signed)
Pulse- 76 Edema-ankles  Pain/pressure- lower abd and vaginal

## 2012-07-06 NOTE — Progress Notes (Signed)
More contractions past 2 days. Wants to be checked. Cervix improved. Sweeping done per request

## 2012-07-06 NOTE — Patient Instructions (Signed)
Normal Labor and Delivery  Your caregiver must first be sure you are in labor. Signs of labor include:  · You may pass what is called "the mucus plug" before labor begins. This is a small amount of blood stained mucus.  · Regular uterine contractions.  · The time between contractions get closer together.  · The discomfort and pain gradually gets more intense.  · Pains are mostly located in the back.  · Pains get worse when walking.  · The cervix (the opening of the uterus becomes thinner (begins to efface) and opens up (dilates).  Once you are in labor and admitted into the hospital or care center, your caregiver will do the following:  · A complete physical examination.  · Check your vital signs (blood pressure, pulse, temperature and the fetal heart rate).  · Do a vaginal examination (using a sterile glove and lubricant) to determine:  · The position (presentation) of the baby (head [vertex] or buttock first).  · The level (station) of the baby's head in the birth canal.  · The effacement and dilatation of the cervix.  · You may have your pubic hair shaved and be given an enema depending on your caregiver and the circumstance.  · An electronic monitor is usually placed on your abdomen. The monitor follows the length and intensity of the contractions, as well as the baby's heart rate.  · Usually, your caregiver will insert an IV in your arm with a bottle of sugar water. This is done as a precaution so that medications can be given to you quickly during labor or delivery.  NORMAL LABOR AND DELIVERY IS DIVIDED UP INTO 3 STAGES:  First Stage  This is when regular contractions begin and the cervix begins to efface and dilate. This stage can last from 3 to 15 hours. The end of the first stage is when the cervix is 100% effaced and 10 centimeters dilated. Pain medications may be given by   · Injection (morphine, demerol, etc.)  · Regional anesthesia (spinal, caudal or epidural, anesthetics given in different locations of  the spine). Paracervical pain medication may be given, which is an injection of and anesthetic on each side of the cervix.  A pregnant woman may request to have "Natural Childbirth" which is not to have any medications or anesthesia during her labor and delivery.  Second Stage  This is when the baby comes down through the birth canal (vagina) and is born. This can take 1 to 4 hours. As the baby's head comes down through the birth canal, you may feel like you are going to have a bowel movement. You will get the urge to bear down and push until the baby is delivered. As the baby's head is being delivered, the caregiver will decide if an episiotomy (a cut in the perineum and vagina area) is needed to prevent tearing of the tissue in this area. The episiotomy is sewn up after the delivery of the baby and placenta. Sometimes a mask with nitrous oxide is given for the mother to breath during the delivery of the baby to help if there is too much pain. The end of Stage 2 is when the baby is fully delivered. Then when the umbilical cord stops pulsating it is clamped and cut.  Third Stage  The third stage begins after the baby is completely delivered and ends after the placenta (afterbirth) is delivered. This usually takes 5 to 30 minutes. After the placenta is delivered, a medication   is given either by intravenous or injection to help contract the uterus and prevent bleeding. The third stage is not painful and pain medication is usually not necessary. If an episiotomy was done, it is repaired at this time.  After the delivery, the mother is watched and monitored closely for 1 to 2 hours to make sure there is no postpartum bleeding (hemorrhage). If there is a lot of bleeding, medication is given to contract the uterus and stop the bleeding.  Document Released: 06/23/2008 Document Revised: 12/07/2011 Document Reviewed: 06/23/2008  [employer]® Patient Information ©2013 [employer].

## 2012-07-08 ENCOUNTER — Inpatient Hospital Stay (HOSPITAL_COMMUNITY)
Admission: AD | Admit: 2012-07-08 | Discharge: 2012-07-08 | Disposition: A | Discharge status: Home / Self Care | Payer: Medicaid Other | Source: Ambulatory Visit | Attending: Obstetrics and Gynecology | Admitting: Obstetrics and Gynecology

## 2012-07-08 ENCOUNTER — Encounter (HOSPITAL_COMMUNITY): Payer: Self-pay

## 2012-07-08 ENCOUNTER — Inpatient Hospital Stay (HOSPITAL_COMMUNITY): Payer: Medicaid Other

## 2012-07-08 DIAGNOSIS — O479 False labor, unspecified: Secondary | ICD-10-CM | POA: Insufficient documentation

## 2012-07-08 NOTE — MAU Provider Note (Signed)
Alexandra Davidson is a 26 y.o. female presenting for eval of contractions. She receives her prenatal care at the [facility] and her pregnancy has been essentially unremarkable other than s/s depression. History OB History    Grav Para Term Preterm Abortions TAB SAB Ect Mult Living   7 2 1 1 4 3  1  2      Past Medical History  Diagnosis Date  . Preterm labor   . Ectopic pregnancy   . Abnormal Pap smear     lsil   Past Surgical History  Procedure Date  . Laparoscopy 08/21/2011    Procedure: LAPAROSCOPY OPERATIVE;  Surgeon: Scheryl Darter, MD;  Location: [facility];  Service: Gynecology;  Laterality: N/A;  Operative Laparoscopy with Left Partial Salpingectomy  . Dilation and curettage of uterus    Family History: family history includes Cancer in her paternal uncle and Diabetes in her maternal grandmother.  There is no history of Anesthesia problems, and Hypotension, and Malignant hyperthermia, and Pseudochol deficiency, . Social History:  reports that she has never smoked. She has never used smokeless tobacco. She reports that she does not drink alcohol or use illicit drugs.    ROS  Dilation: 2.5 Effacement (%): 70 Station: -2 Exam by:: Laurell Josephs RN Blood pressure 133/83, pulse 124, temperature 97.4 F (36.3 C), temperature source Oral, resp. rate 18, height 5\' 8"  (1.727 m), weight 107.593 kg (237 lb 3.2 oz), last menstrual period 10/23/2011. Maternal Exam:  Uterine Assessment: Ctx irreg, mild  Cervix: cx unchanged from last prenatal visit: 2+/70/-2/post  Fetal Exam Fetal Monitor Review: Baseline rate: 140.  Variability: moderate (6-25 bpm).   Pattern: no decelerations.   10x10 accels after NST     Physical Exam  Constitutional: She is oriented to person, place, and time. She appears well-developed.  HENT:  Head: Normocephalic.  Respiratory: Effort normal.  Genitourinary: Vagina normal.  Musculoskeletal: Normal range of motion.  Neurological: She is alert and oriented to  person, place, and time.  Skin: Skin is warm and dry.  Psychiatric: She has a normal mood and affect. Her behavior is normal.   U/S: nl fluid, cephalic, BPP 8/8 within 9 minutes   Prenatal labs: ABO, Rh: O/Positive/-- (04/08 0000) Antibody: Negative (04/08 0000) Rubella: Immune (04/08 0000) RPR: NON REAC (07/10 1050)  HBsAg: Negative (04/08 0000)  HIV: NON REACTIVE (07/10 1050)  GBS:     Assessment/Plan: IUP at 39.4 Braxton Hicks Reassuring FHT with BPP 8/8  D/C home with labor precautions Keep next scheduled clinic appt   Cam Hai 07/08/2012, 11:51 PM

## 2012-07-08 NOTE — MAU Note (Addendum)
Has beening having contractions for weeks tonight feel like they are worse tonight unsure of frequency, no vaginal bleeding, no LOF, patient also states that she has not been feeling the baby move much at all today and he is usually very active all day.

## 2012-07-08 NOTE — MAU Note (Signed)
Patient given ginger ale. 

## 2012-07-10 NOTE — MAU Provider Note (Signed)
Attestation of Attending Supervision of Advanced Practitioner: Evaluation and management procedures were performed by the PA/NP/CNM/OB Fellow under my supervision/collaboration. Chart reviewed and agree with management and plan.  Tabor Denham V 07/10/2012 8:46 PM

## 2012-07-13 ENCOUNTER — Encounter (HOSPITAL_COMMUNITY): Payer: Self-pay | Admitting: *Deleted

## 2012-07-13 ENCOUNTER — Ambulatory Visit (INDEPENDENT_AMBULATORY_CARE_PROVIDER_SITE_OTHER): Payer: Medicaid Other | Admitting: Advanced Practice Midwife

## 2012-07-13 ENCOUNTER — Inpatient Hospital Stay (HOSPITAL_COMMUNITY)
Admission: AD | Admit: 2012-07-13 | Discharge: 2012-07-15 | DRG: 775 | Disposition: A | Discharge status: Home / Self Care | Payer: Medicaid Other | Source: Ambulatory Visit | Attending: Obstetrics & Gynecology | Admitting: Obstetrics & Gynecology

## 2012-07-13 VITALS — BP 134/81 | Temp 97.0°F | Wt 236.1 lb

## 2012-07-13 DIAGNOSIS — O99340 Other mental disorders complicating pregnancy, unspecified trimester: Secondary | ICD-10-CM

## 2012-07-13 DIAGNOSIS — O480 Post-term pregnancy: Principal | ICD-10-CM | POA: Diagnosis present

## 2012-07-13 DIAGNOSIS — Z3480 Encounter for supervision of other normal pregnancy, unspecified trimester: Secondary | ICD-10-CM

## 2012-07-13 DIAGNOSIS — O368190 Decreased fetal movements, unspecified trimester, not applicable or unspecified: Secondary | ICD-10-CM

## 2012-07-13 LAB — POCT URINALYSIS DIP (DEVICE)
Glucose, UA: NEGATIVE mg/dL
Hgb urine dipstick: NEGATIVE
Nitrite: NEGATIVE
Urobilinogen, UA: 1 mg/dL (ref 0.0–1.0)
pH: 7 (ref 5.0–8.0)

## 2012-07-13 LAB — CBC
MCH: 32.5 pg (ref 26.0–34.0)
MCHC: 34.7 g/dL (ref 30.0–36.0)
Platelets: 177 10*3/uL (ref 150–400)
RDW: 12.7 % (ref 11.5–15.5)

## 2012-07-13 LAB — RPR: RPR Ser Ql: NONREACTIVE

## 2012-07-13 LAB — TYPE AND SCREEN: Antibody Screen: NEGATIVE

## 2012-07-13 MED ORDER — LACTATED RINGERS IV SOLN
500.0000 mL | INTRAVENOUS | Status: DC | PRN
  Administered 2012-07-13 – 2012-07-14 (×3): 500 mL via INTRAVENOUS

## 2012-07-13 MED ORDER — IBUPROFEN 600 MG PO TABS: 600.0000 mg | ORAL_TABLET | Freq: Four times a day (QID) | ORAL | Status: DC | PRN

## 2012-07-13 MED ORDER — LACTATED RINGERS IV SOLN
INTRAVENOUS | Status: DC
  Administered 2012-07-13 – 2012-07-14 (×4): via INTRAVENOUS

## 2012-07-13 MED ORDER — CITRIC ACID-SODIUM CITRATE 334-500 MG/5ML PO SOLN: 30.0000 mL | ORAL | Status: DC | PRN

## 2012-07-13 MED ORDER — ACETAMINOPHEN 325 MG PO TABS: 650.0000 mg | ORAL_TABLET | ORAL | Status: DC | PRN

## 2012-07-13 MED ORDER — LIDOCAINE HCL (PF) 1 % IJ SOLN
30.0000 mL | INTRAMUSCULAR | Status: DC | PRN
  Filled 2012-07-13: qty 30

## 2012-07-13 MED ORDER — NALBUPHINE SYRINGE 5 MG/0.5 ML
10.0000 mg | INJECTION | INTRAMUSCULAR | Status: DC | PRN
  Administered 2012-07-13 – 2012-07-14 (×2): 10 mg via INTRAVENOUS
  Filled 2012-07-13 (×2): qty 1

## 2012-07-13 MED ORDER — OXYCODONE-ACETAMINOPHEN 5-325 MG PO TABS: 1.0000 | ORAL_TABLET | ORAL | Status: DC | PRN

## 2012-07-13 MED ORDER — OXYTOCIN 40 UNITS IN LACTATED RINGERS INFUSION - SIMPLE MED
1.0000 m[IU]/min | INTRAVENOUS | Status: DC
  Administered 2012-07-13: 2 m[IU]/min via INTRAVENOUS
  Filled 2012-07-13: qty 1000

## 2012-07-13 MED ORDER — OXYTOCIN BOLUS FROM INFUSION
500.0000 mL | Freq: Once | INTRAVENOUS | Status: AC
  Administered 2012-07-14: 500 mL via INTRAVENOUS
  Filled 2012-07-13: qty 500

## 2012-07-13 MED ORDER — ONDANSETRON HCL 4 MG/2ML IJ SOLN: 4.0000 mg | Freq: Four times a day (QID) | INTRAMUSCULAR | Status: DC | PRN

## 2012-07-13 MED ORDER — OXYTOCIN 40 UNITS IN LACTATED RINGERS INFUSION - SIMPLE MED: 62.5000 mL/h | Freq: Once | INTRAVENOUS | Status: DC

## 2012-07-13 MED ORDER — TERBUTALINE SULFATE 1 MG/ML IJ SOLN: 0.2500 mg | Freq: Once | INTRAMUSCULAR | Status: AC | PRN

## 2012-07-13 MED ORDER — FENTANYL CITRATE 0.05 MG/ML IJ SOLN: 100.0000 ug | INTRAMUSCULAR | Status: DC | PRN

## 2012-07-13 NOTE — Progress Notes (Signed)
Subjective:    Alexandra Davidson is a 26 y.o. female being seen today for her obstetrical visit. She is at [redacted]w[redacted]d gestation. Patient reports no bleeding, no leaking and occasional contractions. Patient reports having some spotting following having membranes scraped last week; this has since resolved. Patient having intermittent contractions throughout the day, but nothing consistent. She reports having severe contractions on Friday and went to the MAU. She was monitored and sent home; told that cervical dilation was 2cm.  Fetal movement: decreased. The last time she felt the baby move was yesterday afternoon. She has not felt it move today.   Menstrual History: OB History    Grav Para Term Preterm Abortions TAB SAB Ect Mult Living   7 2 1 1 4 3  1  2        Patient's last menstrual period was 10/23/2011.      Review of Systems No CP, SOB, vaginal bleeding/discharge, LE edema, HA, vision changes.   Objective:    BP 134/81  Temp 97 F (36.1 C)  Wt 107.094 kg (236 lb 1.6 oz)  LMP 10/23/2011 FHT:  143 BPM  Uterine Size: 40 cm and size equals dates  Presentations: cephalic  Pelvic Exam:              Dilation: 3cm       Effacement: 25%   Station:  -2     Consistency: medium            Position: middle    EFM: BL 140, mod variability, 10x10 accels, few variable decels Assessment:    Pregnancy 40 and 2/7 weeks  Decreased fetal movement  Nonreactive NST  Plan:   Induction: scheduled for today.

## 2012-07-13 NOTE — H&P (Signed)
Alexandra Davidson is a 26 y.o. female [id number] [redacted]w[redacted]d came through the MAU after decreased fetal movement in office. She is post dates with an Estimated Date of Delivery: 07/11/12. She has no current complaints. She would like her epidural as soon as possible, although she is not in any current pain. She reports no complications with her pregnancy. Denies leaking, gush of fluid or vaginal bleeding. Has felt her baby move, but it has been decreased over the last 24 hours.  History OB History    Grav Para Term Preterm Abortions TAB SAB Ect Mult Living   7 2 1 1 4 3  1  2      Past Medical History  Diagnosis Date  . Preterm labor   . Ectopic pregnancy   . Abnormal Pap smear     lsil   Past Surgical History  Procedure Date  . Laparoscopy 08/21/2011    Procedure: LAPAROSCOPY OPERATIVE;  Surgeon: Scheryl Darter, MD;  Location: [facility];  Service: Gynecology;  Laterality: N/A;  Operative Laparoscopy with Left Partial Salpingectomy  . Dilation and curettage of uterus    Family History: family history includes Cancer in her paternal uncle and Diabetes in her maternal grandmother.  There is no history of Anesthesia problems, and Hypotension, and Malignant hyperthermia, and Pseudochol deficiency, . Social History:  reports that she has never smoked. She has never used smokeless tobacco. She reports that she does not drink alcohol or use illicit drugs.   Prenatal Transfer Tool  Maternal Diabetes: No Genetic Screening: Normal Maternal Ultrasounds/Referrals: Normal Fetal Ultrasounds or other Referrals:  None Maternal Substance Abuse:  No Significant Maternal Medications:  None Significant Maternal Lab Results:  Lab values include: Group B Strep negative Other Comments:  None  Review of Systems  Constitutional: Negative.   HENT: Negative.   Skin: Negative.   Psychiatric/Behavioral: Positive for depression.  All other systems reviewed and are negative.     Blood pressure 126/75, pulse 112,  temperature 97.6 F (36.4 C), temperature source Oral, resp. rate 18, height 5\' 8"  (1.727 m), weight 107.049 kg (236 lb), last menstrual period 10/23/2011, SpO2 99.00%, unknown if currently breastfeeding. Maternal Exam:  Introitus: Vagina is negative for discharge.    Physical Exam  Constitutional: She is oriented to person, place, and time. She appears well-developed and well-nourished. No distress.  HENT:  Head: Normocephalic and atraumatic.  Eyes: Conjunctivae normal and EOM are normal. Pupils are equal, round, and reactive to light. Right eye exhibits no discharge. Left eye exhibits no discharge. No scleral icterus.  Cardiovascular: Normal rate, regular rhythm, normal heart sounds and intact distal pulses.  Exam reveals no gallop and no friction rub.   No murmur heard. Respiratory: Effort normal and breath sounds normal. No respiratory distress. She has no wheezes. She has no rales.  GI: Soft. Bowel sounds are normal. She exhibits no distension. There is no tenderness. There is no rebound and no guarding.       gravid  Genitourinary: Vagina normal and uterus normal. No vaginal discharge found.  Musculoskeletal: She exhibits no edema and no tenderness.  Neurological: She is alert and oriented to person, place, and time.  Skin: Skin is warm and dry.  Psychiatric: She has a normal mood and affect.    Prenatal labs: ABO, Rh: O/Positive/-- (04/08 0000) Antibody: Negative (04/08 0000) Rubella: Immune (04/08 0000) RPR: NON REAC (07/10 1050)  HBsAg: Negative (04/08 0000)  HIV: NON REACTIVE (07/10 1050)  GBS:   Negative  Assessment/Plan:  1. Admit to LD with routine orders 2. GBS Negative 3. H/O Depression 4. Induction of labor for post dates  - Foley bulb placement  - Pitocin started  Felix Pacini 07/13/2012, 2:27 PM

## 2012-07-13 NOTE — MAU Note (Signed)
Here for induction for postdates today and had an nonreactive NST.  Here for further monitoring.

## 2012-07-13 NOTE — Progress Notes (Signed)
Pulse- 94  Pressure/pain-pelvic, vaginal area Vaginal discharge- "looks like snot"

## 2012-07-13 NOTE — Progress Notes (Signed)
Alexandra Davidson is a 26 y.o. [id number] at [redacted]w[redacted]d by LMP admitted for induction of labor due to Post dates with decreased fetal movement today in clinic. Due date *Estimated Date of Delivery: 07/11/12.  .Subjective: Patient is currently on Pitocin. She is starting to get uncomfortable and is desiring pain medication.   Objective: BP 135/87  Pulse 79  Temp 98 F (36.7 C) (Oral)  Resp 16  Ht 5\' 8"  (1.727 m)  Wt 107.049 kg (236 lb)  BMI 35.88 kg/m2  SpO2 99%  LMP 10/23/2011  Breastfeeding? Unknown     FHT:  FHR: 140's bpm, variability: moderate,  accelerations:  Present,  decelerations:  Absent UC:   regular, every 2-42minutes SVE:   Dilation: 5 Effacement (%): 70 Station: -1 Exam by:: Renee, Resident Cervix: 4-5 Labs: Lab Results  Component Value Date   WBC 11.3* 07/13/2012   HGB 12.4 07/13/2012   HCT 35.7* 07/13/2012   MCV 93.7 07/13/2012   PLT 177 07/13/2012    Assessment / Plan: Induction of labor due to postterm,  progressing well on pitocin  Labor: Progressing normally and Progressing on Pitocin, will continue to increase then AROM Preeclampsia:  labs stable, no preE Fetal Wellbeing:  Category I Pain Control:  Nubain ordered I/D:  n/a Anticipated MOD:  NSVD  Kuneff, Renee 07/13/2012, 7:09 PM

## 2012-07-13 NOTE — Progress Notes (Signed)
   ARABELLA MANOCCHIO is a 26 y.o. [phone] at [redacted]w[redacted]d  admitted for induction of labor due to post dates, nonreactive NST.  Subjective: IV pain med has helped  Objective: BP 123/73  Pulse 66  Temp 98 F (36.7 C) (Oral)  Resp 18  Ht 5\' 8"  (1.727 m)  Wt 107.049 kg (236 lb)  BMI 35.88 kg/m2  SpO2 99%  LMP 10/23/2011  Breastfeeding? Unknown    FHT:  FHR: 150 bpm, variability: moderate,  accelerations:  Abscent,  decelerations:  Present had several prolonged variables for 1.5 minutes, responded to postition changes UC:   regular, every 2-5 minutes SVE:   4-5/50/-3  Labs: Lab Results  Component Value Date   WBC 11.3* 07/13/2012   HGB 12.4 07/13/2012   HCT 35.7* 07/13/2012   MCV 93.7 07/13/2012   PLT 177 07/13/2012    Assessment / Plan: IOL for NR NST  Labor: early Fetal Wellbeing:  Category II Pain Control:  Fentanyl Anticipated MOD:  NSVD  CRESENZO-DISHMAN,Makyah Lavigne 07/13/2012, 8:58 PM

## 2012-07-14 ENCOUNTER — Encounter (HOSPITAL_COMMUNITY): Payer: Self-pay | Admitting: Anesthesiology

## 2012-07-14 ENCOUNTER — Encounter (HOSPITAL_COMMUNITY): Payer: Self-pay

## 2012-07-14 ENCOUNTER — Inpatient Hospital Stay (HOSPITAL_COMMUNITY): Payer: Medicaid Other | Admitting: Anesthesiology

## 2012-07-14 DIAGNOSIS — O480 Post-term pregnancy: Secondary | ICD-10-CM

## 2012-07-14 DIAGNOSIS — O368190 Decreased fetal movements, unspecified trimester, not applicable or unspecified: Secondary | ICD-10-CM

## 2012-07-14 MED ORDER — DIPHENHYDRAMINE HCL 25 MG PO CAPS: 25.0000 mg | ORAL_CAPSULE | Freq: Four times a day (QID) | ORAL | Status: DC | PRN

## 2012-07-14 MED ORDER — ONDANSETRON HCL 4 MG/2ML IJ SOLN: 4.0000 mg | INTRAMUSCULAR | Status: DC | PRN

## 2012-07-14 MED ORDER — PHENYLEPHRINE 40 MCG/ML (10ML) SYRINGE FOR IV PUSH (FOR BLOOD PRESSURE SUPPORT): 80.0000 ug | PREFILLED_SYRINGE | INTRAVENOUS | Status: DC | PRN

## 2012-07-14 MED ORDER — BENZOCAINE-MENTHOL 20-0.5 % EX AERO
1.0000 "application " | INHALATION_SPRAY | CUTANEOUS | Status: DC | PRN
  Administered 2012-07-14: 1 via TOPICAL
  Filled 2012-07-14: qty 56

## 2012-07-14 MED ORDER — DIBUCAINE 1 % RE OINT: 1.0000 "application " | TOPICAL_OINTMENT | RECTAL | Status: DC | PRN

## 2012-07-14 MED ORDER — SENNOSIDES-DOCUSATE SODIUM 8.6-50 MG PO TABS
2.0000 | ORAL_TABLET | Freq: Every day | ORAL | Status: DC
  Administered 2012-07-14: 2 via ORAL

## 2012-07-14 MED ORDER — IBUPROFEN 600 MG PO TABS
600.0000 mg | ORAL_TABLET | Freq: Four times a day (QID) | ORAL | Status: DC
  Administered 2012-07-14 – 2012-07-15 (×6): 600 mg via ORAL
  Filled 2012-07-14 (×6): qty 1

## 2012-07-14 MED ORDER — INFLUENZA VIRUS VACC SPLIT PF IM SUSP
0.5000 mL | INTRAMUSCULAR | Status: AC
  Administered 2012-07-15: 0.5 mL via INTRAMUSCULAR
  Filled 2012-07-14: qty 0.5

## 2012-07-14 MED ORDER — PHENYLEPHRINE 40 MCG/ML (10ML) SYRINGE FOR IV PUSH (FOR BLOOD PRESSURE SUPPORT)
80.0000 ug | PREFILLED_SYRINGE | INTRAVENOUS | Status: DC | PRN
  Filled 2012-07-14: qty 5

## 2012-07-14 MED ORDER — EPHEDRINE 5 MG/ML INJ
10.0000 mg | INTRAVENOUS | Status: DC | PRN
  Filled 2012-07-14: qty 4

## 2012-07-14 MED ORDER — EPHEDRINE 5 MG/ML INJ: 10.0000 mg | INTRAVENOUS | Status: DC | PRN

## 2012-07-14 MED ORDER — SIMETHICONE 80 MG PO CHEW: 80.0000 mg | CHEWABLE_TABLET | ORAL | Status: DC | PRN

## 2012-07-14 MED ORDER — ZOLPIDEM TARTRATE 5 MG PO TABS: 5.0000 mg | ORAL_TABLET | Freq: Every evening | ORAL | Status: DC | PRN

## 2012-07-14 MED ORDER — ONDANSETRON HCL 4 MG PO TABS: 4.0000 mg | ORAL_TABLET | ORAL | Status: DC | PRN

## 2012-07-14 MED ORDER — OXYTOCIN 40 UNITS IN LACTATED RINGERS INFUSION - SIMPLE MED: 1.0000 m[IU]/min | INTRAVENOUS | Status: DC

## 2012-07-14 MED ORDER — LANOLIN HYDROUS EX OINT: TOPICAL_OINTMENT | CUTANEOUS | Status: DC | PRN

## 2012-07-14 MED ORDER — TETANUS-DIPHTH-ACELL PERTUSSIS 5-2.5-18.5 LF-MCG/0.5 IM SUSP
0.5000 mL | Freq: Once | INTRAMUSCULAR | Status: AC
  Administered 2012-07-15: 0.5 mL via INTRAMUSCULAR
  Filled 2012-07-14: qty 0.5

## 2012-07-14 MED ORDER — DIPHENHYDRAMINE HCL 50 MG/ML IJ SOLN: 12.5000 mg | INTRAMUSCULAR | Status: DC | PRN

## 2012-07-14 MED ORDER — FENTANYL 2.5 MCG/ML BUPIVACAINE 1/10 % EPIDURAL INFUSION (WH - ANES)
14.0000 mL/h | INTRAMUSCULAR | Status: DC
  Administered 2012-07-14: 14 mL/h via EPIDURAL
  Filled 2012-07-14: qty 125

## 2012-07-14 MED ORDER — OXYCODONE-ACETAMINOPHEN 5-325 MG PO TABS
1.0000 | ORAL_TABLET | ORAL | Status: DC | PRN
  Administered 2012-07-14 (×2): 1 via ORAL
  Administered 2012-07-14 – 2012-07-15 (×2): 2 via ORAL
  Filled 2012-07-14: qty 2
  Filled 2012-07-14: qty 1
  Filled 2012-07-14: qty 2
  Filled 2012-07-14: qty 1

## 2012-07-14 MED ORDER — LIDOCAINE HCL (PF) 1 % IJ SOLN
INTRAMUSCULAR | Status: DC | PRN
  Administered 2012-07-14 (×3): 4 mL

## 2012-07-14 MED ORDER — PRENATAL MULTIVITAMIN CH
1.0000 | ORAL_TABLET | Freq: Every day | ORAL | Status: DC
  Administered 2012-07-15: 1 via ORAL
  Filled 2012-07-14 (×2): qty 1

## 2012-07-14 MED ORDER — WITCH HAZEL-GLYCERIN EX PADS: 1.0000 "application " | MEDICATED_PAD | CUTANEOUS | Status: DC | PRN

## 2012-07-14 MED ORDER — LACTATED RINGERS IV SOLN: 500.0000 mL | Freq: Once | INTRAVENOUS | Status: DC

## 2012-07-14 NOTE — Anesthesia Postprocedure Evaluation (Signed)
  Anesthesia Post-op Note  Patient: Alexandra Davidson  Procedure(s) Performed: * No procedures listed *  Patient Location: PACU and Mother/Baby  Anesthesia Type: Epidural  Level of Consciousness: awake, alert  and oriented  Airway and Oxygen Therapy: Patient Spontanous Breathing  Post-op Pain: mild  Post-op Assessment: Patient's Cardiovascular Status Stable, Respiratory Function Stable, No signs of Nausea or vomiting, Adequate PO intake, Pain level controlled, No headache, No backache, No residual numbness and No residual motor weakness  Post-op Vital Signs: stable  Complications: No apparent anesthesia complications

## 2012-07-14 NOTE — Progress Notes (Signed)
UR Chart review completed.  

## 2012-07-14 NOTE — Progress Notes (Signed)
ANASTASHIA WESTERFELD is a 26 y.o. [phone] at [redacted]w[redacted]d admitted for IOL due to postdates.  Subjective: Patient very uncomfortable.  Had epidural placed one hour ago, claims she is feeling contractions like she had before she had it placed.  Objective: BP 145/93  Pulse 79  Temp 97.5 F (36.4 C) (Oral)  Resp 18  Ht 5\' 8"  (1.727 m)  Wt 107.049 kg (236 lb)  BMI 35.88 kg/m2  SpO2 100%  LMP 10/23/2011  Breastfeeding? Unknown      FHT:  FHR: 135 bpm, variability: moderate,  accelerations:  Present,  decelerations:  Present recurrent variable decels, recovering to baseline UC:   regular, every 2-3 minutes SVE:   Dilation: 8.5 Effacement (%): 90 Station: -1;0 Exam by:: Kenetra Hildenbrand, b. md  Labs: Lab Results  Component Value Date   WBC 11.3* 07/13/2012   HGB 12.4 07/13/2012   HCT 35.7* 07/13/2012   MCV 93.7 07/13/2012   PLT 177 07/13/2012    Assessment / Plan: 26 y.o. [id number] at [redacted]w[redacted]d admitted for IOL due to postdates.  Labor: continue pit, patient 9cm Preeclampsia:  n/a Fetal Wellbeing:  Category II Pain Control:  Epidural I/D:  n/a Anticipated MOD:  NSVD  Sonia Side 07/14/2012, 4:37 AM

## 2012-07-14 NOTE — Anesthesia Procedure Notes (Signed)
Epidural Patient location during procedure: OB Start time: 07/14/2012 2:58 AM  Staffing Performed by: anesthesiologist   Preanesthetic Checklist Completed: patient identified, site marked, surgical consent, pre-op evaluation, timeout performed, IV checked, risks and benefits discussed and monitors and equipment checked  Epidural Patient position: sitting Prep: site prepped and draped and DuraPrep Patient monitoring: continuous pulse ox and blood pressure Approach: midline Injection technique: LOR air  Needle:  Needle type: Tuohy  Needle gauge: 17 G Needle length: 9 cm and 9 Needle insertion depth: 6.5 cm Catheter type: closed end flexible Catheter size: 19 Gauge Catheter at skin depth: 11.5 cm Test dose: negative  Assessment Events: blood not aspirated, injection not painful, no injection resistance, negative IV test and no paresthesia  Additional Notes Discussed risk of headache, infection, bleeding, nerve injury and failed or incomplete block.  Patient voices understanding and wishes to proceed. Reason for block:procedure for pain

## 2012-07-14 NOTE — Progress Notes (Signed)
NIKIAH GOIN is a 26 y.o. [phone] at 110w3d admitted for IOL due to post dates  Subjective:   Objective: BP 134/75  Pulse 94  Temp 97.9 F (36.6 C) (Oral)  Resp 18  Ht 5\' 8"  (1.727 m)  Wt 107.049 kg (236 lb)  BMI 35.88 kg/m2  SpO2 99%  LMP 10/23/2011  Breastfeeding? Unknown      FHR: 150 bpm, variability: moderate, accelerations: Abscent, decelerations: Present had several prolonged variables for 1.5 minutes, responded to postition changes UC:   irregular, every 2-4 minutes SVE:   Dilation: 5 Effacement (%): 60 Station: -3 Exam by:: l.poore, rn  Labs: Lab Results  Component Value Date   WBC 11.3* 07/13/2012   HGB 12.4 07/13/2012   HCT 35.7* 07/13/2012   MCV 93.7 07/13/2012   PLT 177 07/13/2012    Assessment / Plan: 26 y.o. [id number] at [redacted]w[redacted]d admitted for IOL due to post dates  Labor: early, continue to check q2-3 while on pit Preeclampsia:  none Fetal Wellbeing:  Category II Pain Control:  Epidural planning I/D:  n/a Anticipated MOD:  NSVD  Sonia Side 07/14/2012, 12:14 AM

## 2012-07-14 NOTE — Anesthesia Preprocedure Evaluation (Signed)
Anesthesia Evaluation  Patient identified by MRN, date of birth, ID band Patient awake    Reviewed: Allergy & Precautions, H&P , NPO status , Patient's Chart, lab work & pertinent test results, reviewed documented beta blocker date and time   History of Anesthesia Complications Negative for: history of anesthetic complications  Airway Mallampati: I TM Distance: >3 FB Neck ROM: full    Dental  (+) Teeth Intact   Pulmonary neg pulmonary ROS,  breath sounds clear to auscultation        Cardiovascular negative cardio ROS  Rhythm:regular Rate:Normal     Neuro/Psych negative neurological ROS  negative psych ROS   GI/Hepatic Neg liver ROS, GERD-  Medicated,  Endo/Other  negative endocrine ROS  Renal/GU negative Renal ROS     Musculoskeletal   Abdominal   Peds  Hematology negative hematology ROS (+)   Anesthesia Other Findings   Reproductive/Obstetrics (+) Pregnancy                           Anesthesia Physical Anesthesia Plan  ASA: II  Anesthesia Plan: Epidural   Post-op Pain Management:    Induction:   Airway Management Planned:   Additional Equipment:   Intra-op Plan:   Post-operative Plan:   Informed Consent: I have reviewed the patients History and Physical, chart, labs and discussed the procedure including the risks, benefits and alternatives for the proposed anesthesia with the patient or authorized representative who has indicated his/her understanding and acceptance.     Plan Discussed with:   Anesthesia Plan Comments:         Anesthesia Quick Evaluation  

## 2012-07-14 NOTE — Progress Notes (Signed)
   Alexandra Davidson is a 26 y.o. [phone] at [redacted]w[redacted]d  admitted for induction of labor due to Non-reactive NST.  Subjective: Doing well with IV medicine  Objective: BP 111/57  Pulse 75  Temp 97.9 F (36.6 C) (Oral)  Resp 18  Ht 5\' 8"  (1.727 m)  Wt 107.049 kg (236 lb)  BMI 35.88 kg/m2  SpO2 99%  LMP 10/23/2011  Breastfeeding? Unknown    FHT:  FHR: 150 bpm, variability: moderate,  accelerations:  Abscent,  decelerations:  Present occ mild variable UC:   irregular, every 4-6 minutes SVE:   4-5/50/-3 by FCD  Labs: Lab Results  Component Value Date   WBC 11.3* 07/13/2012   HGB 12.4 07/13/2012   HCT 35.7* 07/13/2012   MCV 93.7 07/13/2012   PLT 177 07/13/2012   On 15 mu/min of pitocin Assessment / Plan: IOL for NRNST, slow; will change pit order to increase by 2 mu/min  Labor: protracted latent phase Fetal Wellbeing:  Category II Pain Control:  Fentanyl Anticipated MOD:  NSVD  CRESENZO-DISHMAN,Briannie Gutierrez 07/14/2012, 1:52 AM

## 2012-07-15 LAB — CBC
HCT: 32.4 % — ABNORMAL LOW (ref 36.0–46.0)
MCHC: 34 g/dL (ref 30.0–36.0)
Platelets: 162 10*3/uL (ref 150–400)
RDW: 12.8 % (ref 11.5–15.5)
WBC: 11.2 10*3/uL — ABNORMAL HIGH (ref 4.0–10.5)

## 2012-07-15 MED ORDER — IBUPROFEN 600 MG PO TABS: 600.0000 mg | ORAL_TABLET | Freq: Four times a day (QID) | ORAL | Status: DC

## 2012-07-15 NOTE — H&P (Signed)
Attestation of Attending Supervision of Advanced Practitioner (CNM/NP): Evaluation and management procedures were performed by the Advanced Practitioner under my supervision and collaboration.  I have reviewed the Advanced Practitioner's note and chart, and I agree with the management and plan.  HARRAWAY-SMITH, Praveen Coia 12:53 PM

## 2012-07-15 NOTE — Clinical Social Work Psychosocial (Signed)
    Clinical Social Work Department BRIEF PSYCHOSOCIAL ASSESSMENT 07/15/2012  Patient:  Alexandra Davidson, Alexandra Davidson     Account Number:  [id number]     Admit date:  07/13/2012  Clinical Social Worker:  Andy Gauss  Date/Time:  07/15/2012 02:46 PM  Referred by:  Physician  Date Referred:  07/15/2012 Referred for  Behavioral Health Issues   Other Referral:   "Social issues"   Interview type:  Patient Other interview type:    PSYCHOSOCIAL DATA Living Status:  FAMILY Admitted from facility:   Level of care:   Primary support name:  Doreene Nest Primary support relationship to patient:  PARENT Degree of support available:   Involved    CURRENT CONCERNS Current Concerns  Behavioral Health Issues   Other Concerns:    SOCIAL WORK ASSESSMENT / PLAN Sw referral received to assess pt's history of depression. Pt admits to feeling depressed after learning that her FOB was having an affair with her friend.  She was prescribed an antidepressant but only took it one time, as she did not like the way it made her feel.  Pt lives with her mother, who she describes as her primary support person.  She denies any current depression and states she is not concerned about her FOB.  FOB came to see the infant however did not visit long.  She has all the necessary supplies for the infant and appears to be bonding appropriately.   Assessment/plan status:  No Further Intervention Required Other assessment/ plan:   Information/referral to community resources:   Pt agrees to contact a medical professional if depression symptoms arise.    PATIENT'S/FAMILY'S RESPONSE TO PLAN OF CARE: Pt thanked Sw for consult however told Sw that symptoms are resolved.

## 2012-07-15 NOTE — Discharge Summary (Signed)
Obstetric Discharge Summary Reason for Admission: induction of labor Prenatal Procedures: none Intrapartum Procedures: spontaneous vaginal delivery Postpartum Procedures: none Complications-Operative and Postpartum: none Hemoglobin  Date Value Range Status  07/15/2012 11.0* 12.0 - 15.0 g/dL Final     HCT  Date Value Range Status  07/15/2012 32.4* 36.0 - 46.0 % Final    Physical Exam:  Filed Vitals:   07/15/12 0530  BP: 113/72  Pulse: 72  Temp: 97.4 F (36.3 C)  Resp: 18    General: alert, cooperative and no distress Lochia: appropriate Uterine Fundus: firm Incision: none DVT Evaluation: No evidence of DVT seen on physical exam. No significant calf/ankle edema.  Discharge Diagnoses: Term Pregnancy-delivered  Discharge Information: Date: 07/15/2012 Activity: unrestricted Diet: routine Medications: Ibuprofen Condition: stable Instructions: refer to practice specific booklet Discharge to: home Follow-up Information    Follow up with [facility] Schedule an appointment as soon as possible for a visit in 6 weeks.   Contact information:   [address] [place] [address] [phone]         Newborn Data: Live born female  Birth Weight: 7 lb 4.2 oz (3295 g) APGAR: 5, 8  Home with mother.  Alexandra Davidson 07/15/2012, 7:34 AM  I examined pt and agree with documentation above and resident plan of care. [facility]

## 2012-07-15 NOTE — Discharge Summary (Signed)
Attestation of Attending Supervision of Advanced Practitioner (CNM/NP): Evaluation and management procedures were performed by the Advanced Practitioner under my supervision and collaboration.  I have reviewed the Advanced Practitioner's note and chart, and I agree with the management and plan.  HARRAWAY-SMITH, Alyxander Kollmann 12:52 PM     

## 2012-08-12 ENCOUNTER — Ambulatory Visit: Payer: Medicaid Other | Admitting: Advanced Practice Midwife

## 2012-08-15 ENCOUNTER — Encounter: Payer: Self-pay | Admitting: Family Medicine

## 2012-08-15 ENCOUNTER — Other Ambulatory Visit (HOSPITAL_COMMUNITY)
Admission: RE | Admit: 2012-08-15 | Discharge: 2012-08-15 | Disposition: A | Discharge status: Home / Self Care | Payer: Medicaid Other | Source: Ambulatory Visit | Attending: Obstetrics & Gynecology | Admitting: Obstetrics & Gynecology

## 2012-08-15 ENCOUNTER — Ambulatory Visit (INDEPENDENT_AMBULATORY_CARE_PROVIDER_SITE_OTHER): Payer: Medicaid Other | Admitting: Family Medicine

## 2012-08-15 DIAGNOSIS — Z113 Encounter for screening for infections with a predominantly sexual mode of transmission: Secondary | ICD-10-CM | POA: Insufficient documentation

## 2012-08-15 DIAGNOSIS — N760 Acute vaginitis: Secondary | ICD-10-CM | POA: Insufficient documentation

## 2012-08-15 NOTE — Patient Instructions (Signed)
Levonorgestrel intrauterine device (IUD) What is this medicine? LEVONORGESTREL IUD (LEE voe nor jes trel) is a contraceptive (birth control) device. The device is placed inside the uterus by a healthcare professional. It is used to prevent pregnancy and can also be used to treat heavy bleeding that occurs during your period. Depending on the device, it can be used for 3 to 5 years. This medicine may be used for other purposes; ask your health care provider or pharmacist if you have questions. What should I tell my health care provider before I take this medicine? They need to know if you have any of these conditions: -abnormal Pap smear -cancer of the breast, uterus, or cervix -diabetes -endometritis -genital or pelvic infection now or in the past -have more than one sexual partner or your partner has more than one partner -heart disease -history of an ectopic or tubal pregnancy -immune system problems -IUD in place -liver disease or tumor -problems with blood clots or take blood-thinners -use intravenous drugs -uterus of unusual shape -vaginal bleeding that has not been explained -an unusual or allergic reaction to levonorgestrel, other hormones, silicone, or polyethylene, medicines, foods, dyes, or preservatives -pregnant or trying to get pregnant -breast-feeding How should I use this medicine? This device is placed inside the uterus by a health care professional. Talk to your pediatrician regarding the use of this medicine in children. Special care may be needed. Overdosage: If you think you have taken too much of this medicine contact a poison control center or emergency room at once. NOTE: This medicine is only for you. Do not share this medicine with others. What if I miss a dose? This does not apply. What may interact with this medicine? Do not take this medicine with any of the following medications: -amprenavir -bosentan -fosamprenavir This medicine may also interact with  the following medications: -aprepitant -barbiturate medicines for inducing sleep or treating seizures -bexarotene -griseofulvin -medicines to treat seizures like carbamazepine, ethotoin, felbamate, oxcarbazepine, phenytoin, topiramate -modafinil -pioglitazone -rifabutin -rifampin -rifapentine -some medicines to treat HIV infection like atazanavir, indinavir, lopinavir, nelfinavir, tipranavir, ritonavir -St. John's wort -warfarin This list may not describe all possible interactions. Give your health care provider a list of all the medicines, herbs, non-prescription drugs, or dietary supplements you use. Also tell them if you smoke, drink alcohol, or use illegal drugs. Some items may interact with your medicine. What should I watch for while using this medicine? Visit your doctor or health care professional for regular check ups. See your doctor if you or your partner has sexual contact with others, becomes HIV positive, or gets a sexual transmitted disease. This product does not protect you against HIV infection (AIDS) or other sexually transmitted diseases. You can check the placement of the IUD yourself by reaching up to the top of your vagina with clean fingers to feel the threads. Do not pull on the threads. It is a good habit to check placement after each menstrual period. Call your doctor right away if you feel more of the IUD than just the threads or if you cannot feel the threads at all. The IUD may come out by itself. You may become pregnant if the device comes out. If you notice that the IUD has come out use a backup birth control method like condoms and call your health care provider. Using tampons will not change the position of the IUD and are okay to use during your period. What side effects may I notice from receiving this medicine?   Side effects that you should report to your doctor or health care professional as soon as possible: -allergic reactions like skin rash, itching or  hives, swelling of the face, lips, or tongue -fever, flu-like symptoms -genital sores -high blood pressure -no menstrual period for 6 weeks during use -pain, swelling, warmth in the leg -pelvic pain or tenderness -severe or sudden headache -signs of pregnancy -stomach cramping -sudden shortness of breath -trouble with balance, talking, or walking -unusual vaginal bleeding, discharge -yellowing of the eyes or skin Side effects that usually do not require medical attention (report to your doctor or health care professional if they continue or are bothersome): -acne -breast pain -change in sex drive or performance -changes in weight -cramping, dizziness, or faintness while the device is being inserted -headache -irregular menstrual bleeding within first 3 to 6 months of use -nausea This list may not describe all possible side effects. Call your doctor for medical advice about side effects. You may report side effects to FDA at 1-800-FDA-1088. Where should I keep my medicine? This does not apply. NOTE: This sheet is a summary. It may not cover all possible information. If you have questions about this medicine, talk to your doctor, pharmacist, or health care provider.  2013, [employer]/Gold Standard. (10/15/2011 1:54:04 PM)  Contraception Choices Contraception (birth control) is the use of any methods or devices to prevent pregnancy. Below are some methods to help avoid pregnancy. HORMONAL METHODS   Contraceptive implant. This is a thin, plastic tube containing progesterone hormone. It does not contain estrogen hormone. Your caregiver inserts the tube in the inner part of the upper arm. The tube can remain in place for up to 3 years. After 3 years, the implant must be removed. The implant prevents the ovaries from releasing an egg (ovulation), thickens the cervical mucus which prevents sperm from entering the uterus, and thins the lining of the inside of the uterus.  Progesterone-only  injections. These injections are given every 3 months by your caregiver to prevent pregnancy. This synthetic progesterone hormone stops the ovaries from releasing eggs. It also thickens cervical mucus and changes the uterine lining. This makes it harder for sperm to survive in the uterus.  Birth control pills. These pills contain estrogen and progesterone hormone. They work by stopping the egg from forming in the ovary (ovulation). Birth control pills are prescribed by a caregiver.Birth control pills can also be used to treat heavy periods.  Minipill. This type of birth control pill contains only the progesterone hormone. They are taken every day of each month and must be prescribed by your caregiver.  Birth control patch. The patch contains hormones similar to those in birth control pills. It must be changed once a week and is prescribed by a caregiver.  Vaginal ring. The ring contains hormones similar to those in birth control pills. It is left in the vagina for 3 weeks, removed for 1 week, and then a new one is put back in place. The patient must be comfortable inserting and removing the ring from the vagina.A caregiver's prescription is necessary.  Emergency contraception. Emergency contraceptives prevent pregnancy after unprotected sexual intercourse. This pill can be taken right after sex or up to 5 days after unprotected sex. It is most effective the sooner you take the pills after having sexual intercourse. Emergency contraceptive pills are available without a prescription. Check with your pharmacist. Do not use emergency contraception as your only form of birth control. BARRIER METHODS   Female condom. This is  a thin sheath (latex or rubber) that is worn over the penis during sexual intercourse. It can be used with spermicide to increase effectiveness.  Female condom. This is a soft, loose-fitting sheath that is put into the vagina before sexual intercourse.  Diaphragm. This is a soft,  latex, dome-shaped barrier that must be fitted by a caregiver. It is inserted into the vagina, along with a spermicidal jelly. It is inserted before intercourse. The diaphragm should be left in the vagina for 6 to 8 hours after intercourse.  Cervical cap. This is a round, soft, latex or plastic cup that fits over the cervix and must be fitted by a caregiver. The cap can be left in place for up to 48 hours after intercourse.  Sponge. This is a soft, circular piece of polyurethane foam. The sponge has spermicide in it. It is inserted into the vagina after wetting it and before sexual intercourse.  Spermicides. These are chemicals that kill or block sperm from entering the cervix and uterus. They come in the form of creams, jellies, suppositories, foam, or tablets. They do not require a prescription. They are inserted into the vagina with an applicator before having sexual intercourse. The process must be repeated every time you have sexual intercourse. INTRAUTERINE CONTRACEPTION  Intrauterine device (IUD). This is a T-shaped device that is put in a woman's uterus during a menstrual period to prevent pregnancy. There are 2 types:  Copper IUD. This type of IUD is wrapped in copper wire and is placed inside the uterus. Copper makes the uterus and fallopian tubes produce a fluid that kills sperm. It can stay in place for 10 years.  Hormone IUD. This type of IUD contains the hormone progestin (synthetic progesterone). The hormone thickens the cervical mucus and prevents sperm from entering the uterus, and it also thins the uterine lining to prevent implantation of a fertilized egg. The hormone can weaken or kill the sperm that get into the uterus. It can stay in place for 5 years. PERMANENT METHODS OF CONTRACEPTION  Female tubal ligation. This is when the woman's fallopian tubes are surgically sealed, tied, or blocked to prevent the egg from traveling to the uterus.  Female sterilization. This is when the  female has the tubes that carry sperm tied off (vasectomy).This blocks sperm from entering the vagina during sexual intercourse. After the procedure, the man can still ejaculate fluid (semen). NATURAL PLANNING METHODS  Natural family planning. This is not having sexual intercourse or using a barrier method (condom, diaphragm, cervical cap) on days the woman could become pregnant.  Calendar method. This is keeping track of the length of each menstrual cycle and identifying when you are fertile.  Ovulation method. This is avoiding sexual intercourse during ovulation.  Symptothermal method. This is avoiding sexual intercourse during ovulation, using a thermometer and ovulation symptoms.  Post-ovulation method. This is timing sexual intercourse after you have ovulated. Regardless of which type or method of contraception you choose, it is important that you use condoms to protect against the transmission of sexually transmitted diseases (STDs). Talk with your caregiver about which form of contraception is most appropriate for you. Document Released: 09/14/2005 Document Revised: 12/07/2011 Document Reviewed: 01/21/2011 [facility] Patient Information 2013 [place], [place].

## 2012-08-15 NOTE — Progress Notes (Signed)
Patient ID: Alexandra Davidson, female   DOB: September 18, 1986, 26 y.o.   MRN: [id number]  Subjective:     Alexandra Davidson is a 26 y.o. female who presents for a postpartum visit. She is 4 weeks postpartum following a spontaneous vaginal delivery. I have fully reviewed the prenatal and intrapartum course. The delivery was at 40 3/7 gestational weeks. Outcome: spontaneous vaginal delivery. Anesthesia: epidural. Postpartum course has been uneventful. Baby's course has been normal. Baby is feeding by both breast and bottle (mostly breast). Bleeding: occasional spotting. Bowel function is normal. Bladder function is normal. Patient is not sexually active. Contraception method is none and desires IUD.Marland Kitchen Postpartum depression screening: negative.  The following portions of the patient's history were reviewed and updated as appropriate: allergies, current medications, past family history, past medical history, past social history, past surgical history and problem list.  Review of Systems Constitutional: negative for chills, fatigue and fevers Respiratory: positive for cough and chest pain/tightness after shower, negative for asthma, dyspnea on exertion and wheezing Cardiovascular: positive for mild chest pain/tightness after shower Gastrointestinal: negative Genitourinary:negative Integument/breast: negative Neurological: negative Behavioral/Psych: negative   Objective:    BP 123/79  Pulse 97  Ht 5' 7.5" (1.715 m)  Wt 220 lb 6.4 oz (99.973 kg)  BMI 34.01 kg/m2  Breastfeeding? Yes  General:  alert, cooperative and no distress   Breasts:  negative  Lungs: clear to auscultation bilaterally  Heart:  regular rate and rhythm, S1, S2 normal, no murmur, click, rub or gallop  Abdomen: soft, non-tender; bowel sounds normal; no masses,  no organomegaly   Vulva:  normal  Vagina: normal vagina and small amount of clear/yellow mucoid discharge and slight odor  Cervix:  multiparous appearance, no cervical  motion tenderness and no lesions  Corpus: normal size, contour, position, consistency, mobility, non-tender  Adnexa:  normal adnexa and no mass, fullness, tenderness  Rectal Exam: Not performed.        Assessment:    Normal 4-week postpartum exam. Pap smear not done at today's visit as normal pap in April 2013. GC/Chlamydia, trich, wet prep collected for hx positive GC/Chlam and some vag odor.   Plan:    1. Contraception: plan IUD placement in 1-2 weeks 2. Chest pain/tightness -likely due to URI/cough. Call or f/u if needed. 3. Follow up in: 1 week

## 2012-08-31 ENCOUNTER — Ambulatory Visit (INDEPENDENT_AMBULATORY_CARE_PROVIDER_SITE_OTHER): Payer: Medicaid Other | Admitting: Obstetrics & Gynecology

## 2012-08-31 ENCOUNTER — Encounter: Payer: Self-pay | Admitting: Obstetrics & Gynecology

## 2012-08-31 VITALS — BP 100/71 | HR 65 | Temp 97.7°F | Resp 12 | Ht 67.5 in | Wt 216.4 lb

## 2012-08-31 DIAGNOSIS — Z01812 Encounter for preprocedural laboratory examination: Secondary | ICD-10-CM

## 2012-08-31 DIAGNOSIS — Z30430 Encounter for insertion of intrauterine contraceptive device: Secondary | ICD-10-CM | POA: Insufficient documentation

## 2012-08-31 MED ORDER — LEVONORGESTREL 20 MCG/24HR IU IUD
INTRAUTERINE_SYSTEM | Freq: Once | INTRAUTERINE | Status: AC
  Administered 2012-08-31: 16:00:00 via INTRAUTERINE

## 2012-08-31 NOTE — Patient Instructions (Signed)
Levonorgestrel intrauterine device (IUD) What is this medicine? LEVONORGESTREL IUD (LEE voe nor jes trel) is a contraceptive (birth control) device. The device is placed inside the uterus by a healthcare professional. It is used to prevent pregnancy and can also be used to treat heavy bleeding that occurs during your period. Depending on the device, it can be used for 3 to 5 years. This medicine may be used for other purposes; ask your health care provider or pharmacist if you have questions. What should I tell my health care provider before I take this medicine? They need to know if you have any of these conditions: -abnormal Pap smear -cancer of the breast, uterus, or cervix -diabetes -endometritis -genital or pelvic infection now or in the past -have more than one sexual partner or your partner has more than one partner -heart disease -history of an ectopic or tubal pregnancy -immune system problems -IUD in place -liver disease or tumor -problems with blood clots or take blood-thinners -use intravenous drugs -uterus of unusual shape -vaginal bleeding that has not been explained -an unusual or allergic reaction to levonorgestrel, other hormones, silicone, or polyethylene, medicines, foods, dyes, or preservatives -pregnant or trying to get pregnant -breast-feeding How should I use this medicine? This device is placed inside the uterus by a health care professional. Talk to your pediatrician regarding the use of this medicine in children. Special care may be needed. Overdosage: If you think you have taken too much of this medicine contact a poison control center or emergency room at once. NOTE: This medicine is only for you. Do not share this medicine with others. What if I miss a dose? This does not apply. What may interact with this medicine? Do not take this medicine with any of the following medications: -amprenavir -bosentan -fosamprenavir This medicine may also interact with  the following medications: -aprepitant -barbiturate medicines for inducing sleep or treating seizures -bexarotene -griseofulvin -medicines to treat seizures like carbamazepine, ethotoin, felbamate, oxcarbazepine, phenytoin, topiramate -modafinil -pioglitazone -rifabutin -rifampin -rifapentine -some medicines to treat HIV infection like atazanavir, indinavir, lopinavir, nelfinavir, tipranavir, ritonavir -St. John's wort -warfarin This list may not describe all possible interactions. Give your health care provider a list of all the medicines, herbs, non-prescription drugs, or dietary supplements you use. Also tell them if you smoke, drink alcohol, or use illegal drugs. Some items may interact with your medicine. What should I watch for while using this medicine? Visit your doctor or health care professional for regular check ups. See your doctor if you or your partner has sexual contact with others, becomes HIV positive, or gets a sexual transmitted disease. This product does not protect you against HIV infection (AIDS) or other sexually transmitted diseases. You can check the placement of the IUD yourself by reaching up to the top of your vagina with clean fingers to feel the threads. Do not pull on the threads. It is a good habit to check placement after each menstrual period. Call your doctor right away if you feel more of the IUD than just the threads or if you cannot feel the threads at all. The IUD may come out by itself. You may become pregnant if the device comes out. If you notice that the IUD has come out use a backup birth control method like condoms and call your health care provider. Using tampons will not change the position of the IUD and are okay to use during your period. What side effects may I notice from receiving this medicine?   Side effects that you should report to your doctor or health care professional as soon as possible: -allergic reactions like skin rash, itching or  hives, swelling of the face, lips, or tongue -fever, flu-like symptoms -genital sores -high blood pressure -no menstrual period for 6 weeks during use -pain, swelling, warmth in the leg -pelvic pain or tenderness -severe or sudden headache -signs of pregnancy -stomach cramping -sudden shortness of breath -trouble with balance, talking, or walking -unusual vaginal bleeding, discharge -yellowing of the eyes or skin Side effects that usually do not require medical attention (report to your doctor or health care professional if they continue or are bothersome): -acne -breast pain -change in sex drive or performance -changes in weight -cramping, dizziness, or faintness while the device is being inserted -headache -irregular menstrual bleeding within first 3 to 6 months of use -nausea This list may not describe all possible side effects. Call your doctor for medical advice about side effects. You may report side effects to FDA at 1-800-FDA-1088. Where should I keep my medicine? This does not apply. NOTE: This sheet is a summary. It may not cover all possible information. If you have questions about this medicine, talk to your doctor, pharmacist, or health care provider.  2013, Elsevier/Gold Standard. (10/15/2011 1:54:04 PM)  

## 2012-08-31 NOTE — Progress Notes (Signed)
  Subjective:    Patient ID: Alexandra Davidson, female    DOB: 1986-01-02, 26 y.o.   MRN: [id number]  [id number] No LMP recorded. Patient is not currently having periods (Reason: Lactating). Patient requests insertion of Mirena IUD. She has abstained since delivery 7 weeks ago. The procedure and risks were explained.    Review of Systems No bleeding, pain, discharge    Objective:   Physical Exam Filed Vitals:   08/31/12 1448  BP: 100/71  Pulse: 65  Temp: 97.7 F (36.5 C)  TempSrc: Oral  Resp: 12  Height: 5' 7.5" (1.715 m)  Weight: 216 lb 6.4 oz (98.158 kg)  Cervix normal  Patient identified, informed consent performed, signed copy in chart, time out was performed.  Urine pregnancy test negative.  Speculum placed in the vagina.  Cervix visualized.  Cleaned with Betadine x 2.  Grasped anteriourly with a single tooth tenaculum.  Uterus sounded to 9 cm.  Mirena IUD placed per manufacturer's recommendations.  Strings trimmed to 2 cm.   Patient given post procedure instructions and Mirena care card with expiration date.  Patient is asked to check IUD strings periodically and follow up in 4-6 weeks for IUD check.         Assessment & Plan:  Mirena insertion RTC 4 weeks  Alexandra Davidson No LMP recorded. Patient is not currently having periods (Reason: Lactating). 3:48 PM

## 2012-09-30 ENCOUNTER — Ambulatory Visit (INDEPENDENT_AMBULATORY_CARE_PROVIDER_SITE_OTHER): Payer: Medicaid Other | Admitting: Obstetrics and Gynecology

## 2012-09-30 ENCOUNTER — Encounter: Payer: Self-pay | Admitting: Obstetrics and Gynecology

## 2012-09-30 VITALS — BP 128/76 | HR 70 | Temp 96.7°F | Ht 67.5 in | Wt 219.5 lb

## 2012-09-30 DIAGNOSIS — Z30431 Encounter for routine checking of intrauterine contraceptive device: Secondary | ICD-10-CM

## 2012-09-30 NOTE — Patient Instructions (Signed)
Intrauterine Device Information  An intrauterine device (IUD) is inserted into your uterus and prevents pregnancy. There are 2 types of IUDs available:  · Copper IUD. This type of IUD is wrapped in copper wire and is placed inside the uterus. Copper makes the uterus and fallopian tubes produce a fluid that kills sperm. The copper IUD can stay in place for 10 years.  · Hormone IUD. This type of IUD contains the hormone progestin (synthetic progesterone). The hormone thickens the cervical mucus and prevents sperm from entering the uterus, and it also thins the uterine lining to prevent implantation of a fertilized egg. The hormone can weaken or kill the sperm that get into the uterus. The hormone IUD can stay in place for 5 years.  Your caregiver will make sure you are a good candidate for a contraceptive IUD. Discuss with your caregiver the possible side effects.  ADVANTAGES  · It is highly effective, reversible, long-acting, and low maintenance.  · There are no estrogen-related side effects.  · An IUD can be used when breastfeeding.  · It is not associated with weight gain.  · It works immediately after insertion.  · The copper IUD does not interfere with your female hormones.  · The progesterone IUD can make heavy menstrual periods lighter.  · The progesterone IUD can be used for 5 years.  · The copper IUD can be used for 10 years.  DISADVANTAGES  · The progesterone IUD can be associated with irregular bleeding patterns.  · The copper IUD can make your menstrual flow heavier and more painful.  · You may experience cramping and vaginal bleeding after insertion.  Document Released: 08/18/2004 Document Revised: 12/07/2011 Document Reviewed: 01/17/2011  ExitCare® Patient Information ©2013 [employer].

## 2012-09-30 NOTE — Progress Notes (Signed)
  Subjective:    Patient ID: [name], female    DOB: 1986-07-20, 27 y.o.   MRN: [id number]  HPI 27 yo [id number] who is 2.5 months post NSVD presents for Mirena string check. Device inserted 1 month ago. Has not done string check herself. Sexually active without any strings felt. Intermittent light spotting since insertion.    Normal Pap 01/04/12 Review of Systems Essentially negative. Denies depression though has past hx.; not on meds. Had GHTN. Reports subcostal L>R and epigastric "tightness" episodes lasting several seconds since last visit. No SOB, CP, cough. Had GERD sx in pregnancy.    Objective:   Physical Exam BP 128/76  Pulse 70  Temp 96.7 F (35.9 C) (Oral)  Ht 5' 7.5" (1.715 m)  Wt 219 lb 8 oz (99.565 kg)  BMI 33.87 kg/m2  Breastfeeding? No Gen: WN, WD in NAD Lungs CTAB Cor RRR w/o m Abd: soft, NT Pelvice: NEFG; physiologic d/c. Mirena strings in place      Assessment & Plan:  IUD string check visit. Doing well Return prn or 12 mo for Annual exsm Danae Orleans, CNM 10/03/2012 8:28 AM

## 2013-08-03 ENCOUNTER — Other Ambulatory Visit: Payer: Self-pay

## 2014-01-16 DIAGNOSIS — J302 Other seasonal allergic rhinitis: Secondary | ICD-10-CM | POA: Insufficient documentation

## 2014-07-11 IMAGING — US US FETAL BPP W/O NONSTRESS
1 series · 8 of 8 positions shown · non-contrast
Comparison: none

CLINICAL DATA: Nonreactive NST.

[Series 1: us fetal bpp w/o nonstress · non-contrast · 8 acquisitions, 8 frames shown]
[im 1/8]
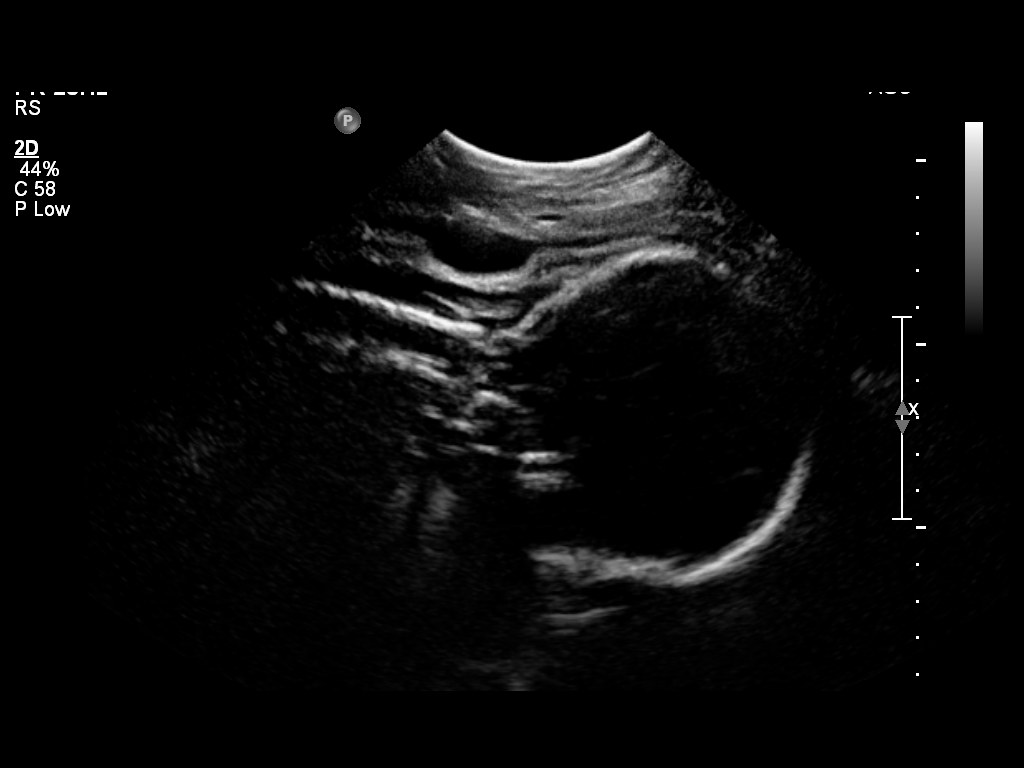
[im 2/8]
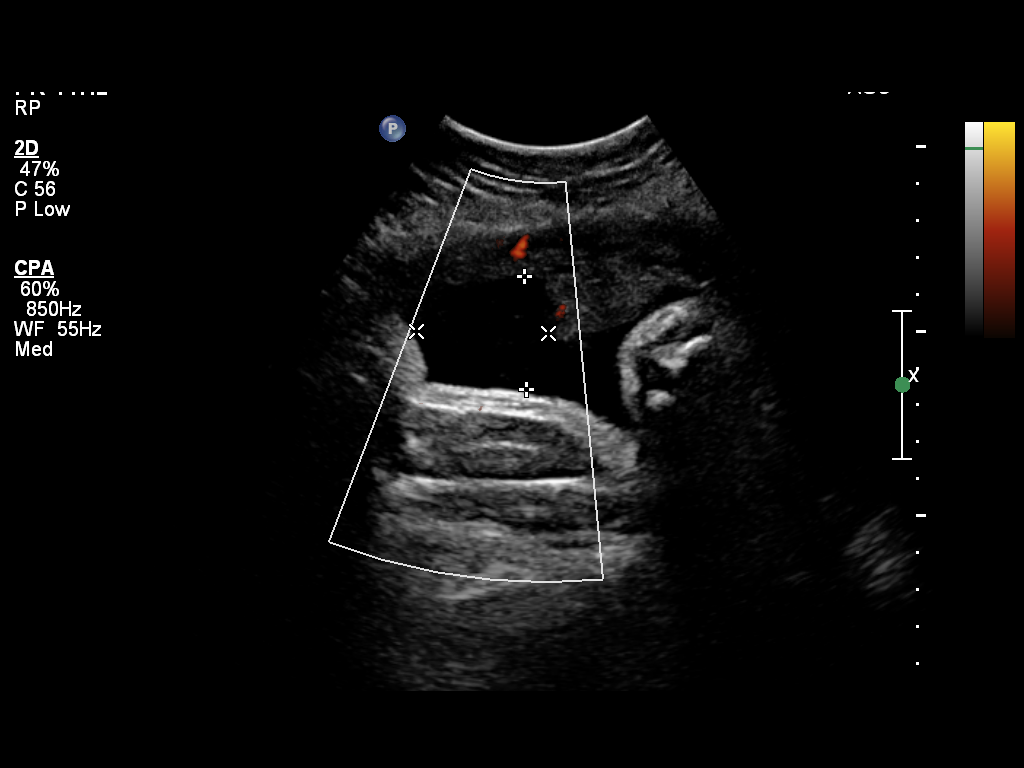
[im 3/8]
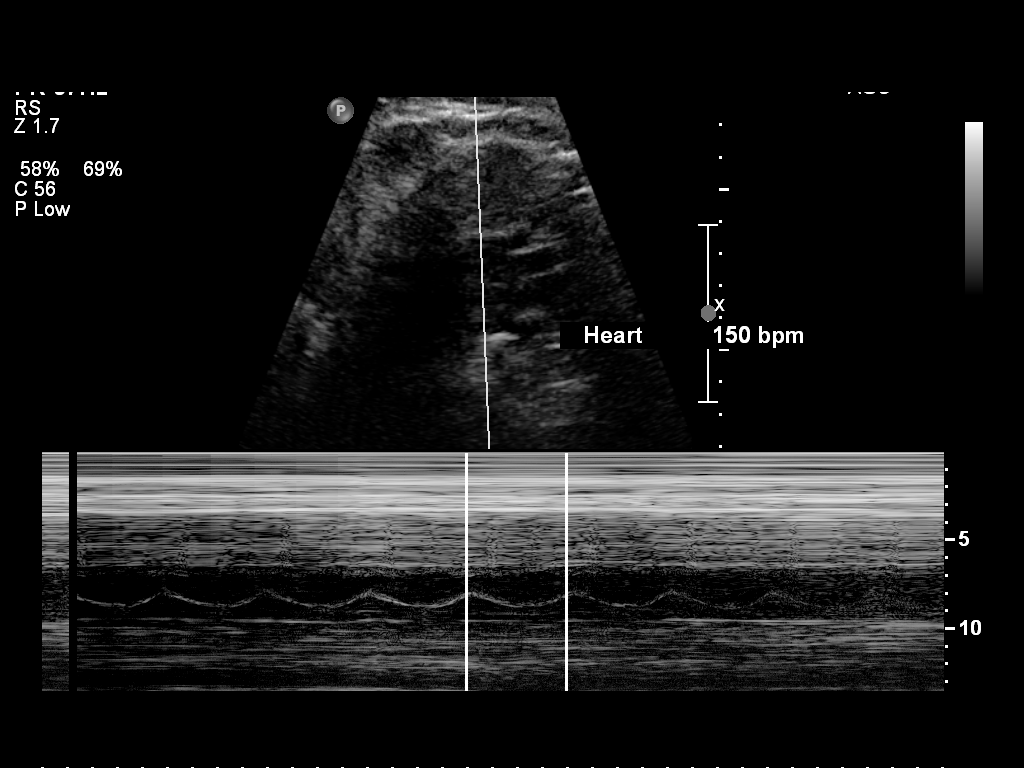
[im 4/8]
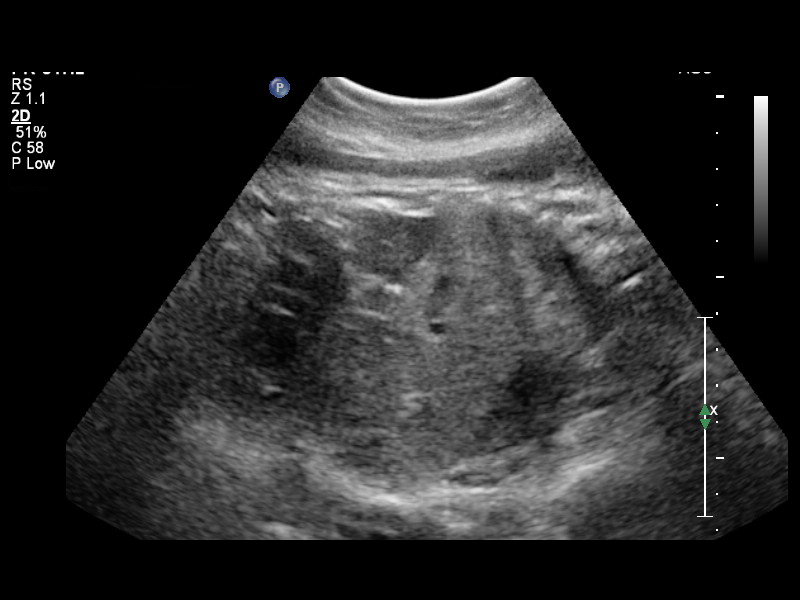
[im 5/8]
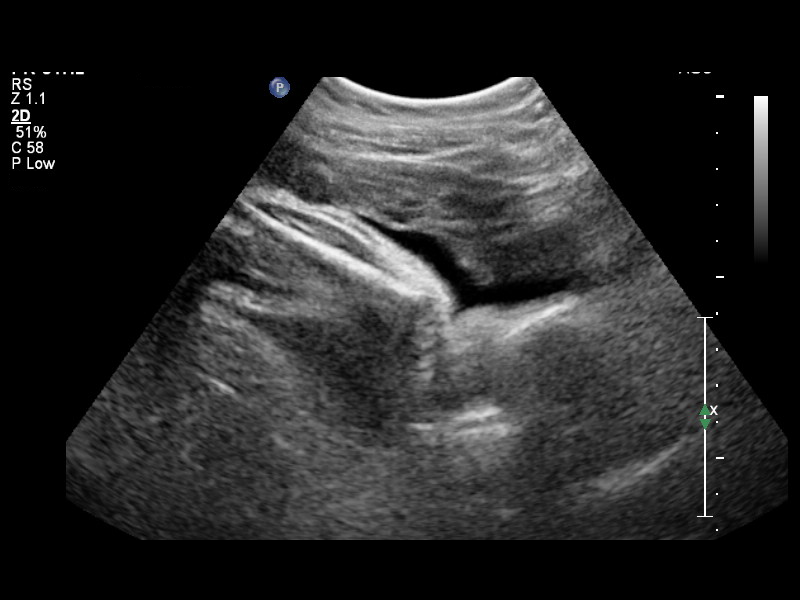
[im 6/8]
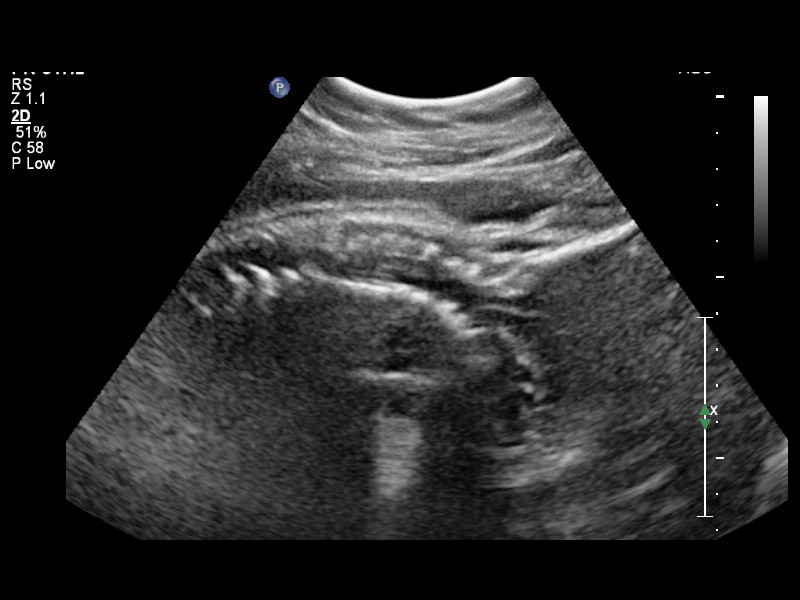
[im 7/8]
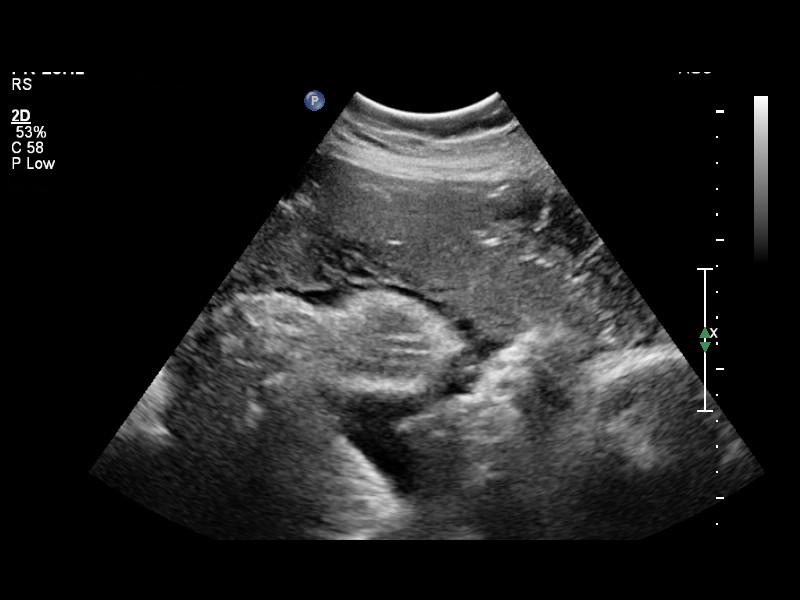
[im 8/8]
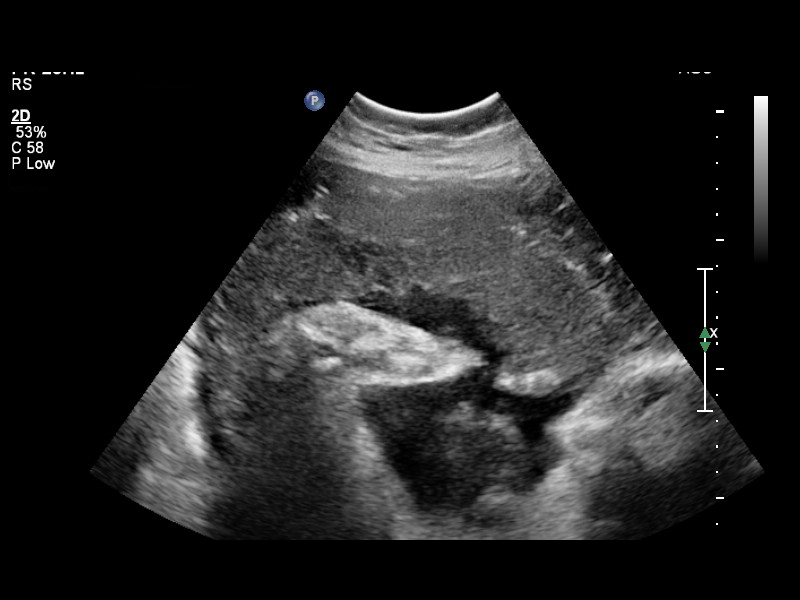

[8 of 8 positions shown; findings below may reference images not displayed]

LIMITED OBSTETRIC ULTRASOUND

BIOPHYSICAL PROFILE

Number of Fetuses: 1
Heart Rate: 150 bpm
Movement: Present
Presentation: Cephalic

Amniotic Fluid (Subjective): Normal

Vertical pocket:  3.05cm

BIOPHYSICAL PROFILE

Movement: 2                   Time: 9 min
Breathing: 2
Tone: 2
Amniotic Fluid: 2

Total Score: 8
IMPRESSION: Single live intrauterine gestation with assigned gestational age 39
weeks 4 days.

Biophysical profile [DATE].

## 2014-07-30 ENCOUNTER — Encounter: Payer: Self-pay | Admitting: Obstetrics and Gynecology

## 2015-04-10 ENCOUNTER — Emergency Department (HOSPITAL_COMMUNITY)
Admission: EM | Admit: 2015-04-10 | Discharge: 2015-04-10 | Disposition: A | Discharge status: Home / Self Care | Payer: Commercial Managed Care - HMO | Attending: Emergency Medicine | Admitting: Emergency Medicine

## 2015-04-10 ENCOUNTER — Emergency Department (HOSPITAL_COMMUNITY): Payer: Commercial Managed Care - HMO

## 2015-04-10 ENCOUNTER — Encounter (HOSPITAL_COMMUNITY): Payer: Self-pay

## 2015-04-10 DIAGNOSIS — Y939 Activity, unspecified: Secondary | ICD-10-CM | POA: Insufficient documentation

## 2015-04-10 DIAGNOSIS — Y929 Unspecified place or not applicable: Secondary | ICD-10-CM | POA: Insufficient documentation

## 2015-04-10 DIAGNOSIS — W010XXA Fall on same level from slipping, tripping and stumbling without subsequent striking against object, initial encounter: Secondary | ICD-10-CM | POA: Insufficient documentation

## 2015-04-10 DIAGNOSIS — S93401A Sprain of unspecified ligament of right ankle, initial encounter: Secondary | ICD-10-CM | POA: Diagnosis not present

## 2015-04-10 DIAGNOSIS — S99911A Unspecified injury of right ankle, initial encounter: Secondary | ICD-10-CM | POA: Diagnosis present

## 2015-04-10 DIAGNOSIS — Z8751 Personal history of pre-term labor: Secondary | ICD-10-CM | POA: Insufficient documentation

## 2015-04-10 DIAGNOSIS — Y999 Unspecified external cause status: Secondary | ICD-10-CM | POA: Diagnosis not present

## 2015-04-10 DIAGNOSIS — Z79899 Other long term (current) drug therapy: Secondary | ICD-10-CM | POA: Insufficient documentation

## 2015-04-10 MED ORDER — HYDROCODONE-ACETAMINOPHEN 5-325 MG PO TABS
1.0000 | ORAL_TABLET | Freq: Once | ORAL | Status: AC
  Administered 2015-04-10: 1 via ORAL
  Filled 2015-04-10: qty 1

## 2015-04-10 MED ORDER — NAPROXEN 500 MG PO TABS: 500.0000 mg | ORAL_TABLET | Freq: Two times a day (BID) | ORAL | Status: DC | PRN

## 2015-04-10 MED ORDER — HYDROCODONE-ACETAMINOPHEN 5-325 MG PO TABS: 1.0000 | ORAL_TABLET | Freq: Four times a day (QID) | ORAL | Status: DC | PRN

## 2015-04-10 NOTE — ED Notes (Signed)
Pt fell on rt ankle x 1 hour ago.  Pt states ankle rolled.  Pt tripped for fall.

## 2015-04-10 NOTE — ED Provider Notes (Signed)
CSN: [id number]     Arrival date & time 04/10/15  1709 History   This chart was scribed for [employer], PA-C working with Azalia Bilis, MD by Elveria Rising, ED Scribe. This patient wast pain  seen in room WTR9/WTR9 and the patient's care was started at 6:11 PM.   Chief Complaint  Patient presents with  . Ankle Pain   Patient is a 29 y.o. female presenting with ankle pain. The history is provided by the patient. No language interpreter was used.  Ankle Pain Location:  Ankle Time since incident:  2 hours Injury: yes   Mechanism of injury comment:  Twisted Ankle location:  R ankle Pain details:    Quality:  Aching   Radiates to:  Does not radiate   Severity:  Moderate   Onset quality:  Sudden   Duration:  1 hour   Timing:  Constant   Progression:  Unchanged Chronicity:  New Prior injury to area:  No Relieved by:  Ice Worsened by:  Bearing weight Ineffective treatments:  None tried Associated symptoms: swelling   Associated symptoms: no decreased ROM, no fever, no muscle weakness, no numbness and no tingling    HPI Comments: Alexandra Davidson is a 29 y.o. female who presents to the Emergency Department complaining of constant right ankle pain resulting from mechanical fall approximately one hour ago when she twisted her ankle. Patient denies striking her head or loss of consciousness. Patient describes constant pain which radiates into her right foot, currently rated at 9/10, described as aching; aggravating factors include bearing weight and ambulation. She has tried ice with minimal relief. Associated symptoms include swelling. Patient denies numbness, tingling, weakness, chest pain, nausea, shortness of breath, bruising, or other injuries..  Past Medical History  Diagnosis Date  . Preterm labor   . Ectopic pregnancy   . Abnormal Pap smear     lsil   Past Surgical History  Procedure Laterality Date  . Laparoscopy  08/21/2011    Procedure: LAPAROSCOPY OPERATIVE;   Surgeon: Scheryl Darter, MD;  Location: [facility];  Service: Gynecology;  Laterality: N/A;  Operative Laparoscopy with Left Partial Salpingectomy  . Dilation and curettage of uterus     Family History  Problem Relation Age of Onset  . Anesthesia problems Neg Hx   . Hypotension Neg Hx   . Malignant hyperthermia Neg Hx   . Pseudochol deficiency Neg Hx   . Cancer Paternal Uncle   . Diabetes Maternal Grandmother    History  Substance Use Topics  . Smoking status: Never Smoker   . Smokeless tobacco: Never Used  . Alcohol Use: No   OB History    Gravida Para Term Preterm AB TAB SAB Ectopic Multiple Living   7 3 2 1 4 3  1  3      Review of Systems  Constitutional: Negative for fever and chills.  HENT: Negative for facial swelling (no head injury).   Respiratory: Negative for shortness of breath.   Cardiovascular: Negative for chest pain.  Gastrointestinal: Negative for nausea, vomiting and abdominal pain.  Musculoskeletal: Positive for joint swelling and arthralgias. Negative for myalgias.  Skin: Negative for color change.  Allergic/Immunologic: Negative for immunocompromised state.  Neurological: Negative for syncope, weakness and numbness.  10 Systems reviewed and are negative for acute change except as noted in the HPI.     Allergies  Review of patient's allergies indicates no known allergies.  Home Medications   Prior to Admission medications   Medication  Sig Start Date End Date Taking? Authorizing Provider  fexofenadine (ALLEGRA) 30 MG tablet Take 30 mg by mouth daily.   Yes Historical Provider, MD  ibuprofen (ADVIL,MOTRIN) 600 MG tablet Take 1 tablet (600 mg total) by mouth every 6 (six) hours. Patient not taking: Reported on 04/10/2015 07/15/12   Sonia SideBrian Kaderli, MD   Triage Vitals: BP 115/81 mmHg  Pulse 71  Temp(Src) 98.5 F (36.9 C) (Oral)  Resp 18  SpO2 100%  LMP 04/10/2015 Physical Exam  Constitutional: She is oriented to person, place, and time. Vital signs are  normal. She appears well-developed and well-nourished.  Non-toxic appearance. No distress.  Afebrile, nontoxic, NAD  HENT:  Head: Normocephalic and atraumatic.  Mouth/Throat: Mucous membranes are normal.  Eyes: Conjunctivae and EOM are normal. Right eye exhibits no discharge. Left eye exhibits no discharge.  Neck: Normal range of motion. Neck supple.  Cardiovascular: Normal rate and intact distal pulses.   Pulmonary/Chest: Effort normal. No respiratory distress.  Abdominal: Normal appearance. She exhibits no distension.  Musculoskeletal:       Right ankle: She exhibits decreased range of motion (due to pain) and swelling. She exhibits no ecchymosis, no deformity, no laceration and normal pulse. Tenderness. Lateral malleolus and medial malleolus tenderness found. Achilles tendon normal.       Feet:  R ankle with mildly limited ROM due to pain, slight swelling without deformity, with mild TTP of medial and lateral malleoli but no TTP or swelling of fore foot or calf. No break in skin. No bruising or erythema. No warmth. Achilles intact. Good pedal pulse and cap refill of all toes. Wiggling toes without difficulty. Sensation grossly intact.   Neurological: She is alert and oriented to person, place, and time. She has normal strength. No sensory deficit.  Skin: Skin is warm, dry and intact. No rash noted.  Psychiatric: She has a normal mood and affect. Her behavior is normal.  Nursing note and vitals reviewed.   ED Course  Procedures (including critical care time)  COORDINATION OF CARE: 6:14 PM- Discussed treatment plan with patient at bedside and patient agreed to plan.   Labs Review Labs Reviewed - No data to display  Imaging Review Dg Ankle Complete Right  04/10/2015   CLINICAL DATA:  Fall, ankle pain.  Fall today  EXAM: RIGHT ANKLE - COMPLETE 3+ VIEW  COMPARISON:  None.  FINDINGS: Ankle mortise intact. The talar dome is normal. No malleolar fracture. The calcaneus is normal.   IMPRESSION: No fracture or dislocation.   Electronically Signed   By: Genevive BiStewart  Edmunds M.D.   On: 04/10/2015 18:22   Dg Foot Complete Right  04/10/2015   CLINICAL DATA:  Pt states she fell today at home, unsure how, and bent right foot back. Pt complains of lateral right ankle and foot pain.Shielded pt.  EXAM: RIGHT FOOT COMPLETE - 3+ VIEW  COMPARISON:  None.  FINDINGS: No fracture or dislocation of mid foot or forefoot. The phalanges are normal. The calcaneus is normal. No soft tissue abnormality.  IMPRESSION: No fracture or dislocation.   Electronically Signed   By: Genevive BiStewart  Edmunds M.D.   On: 04/10/2015 18:24     EKG Interpretation None      MDM   Final diagnoses:  Ankle sprain, right, initial encounter    29 y.o. female here with R ankle pain after twisting it. Neurovascularly intact with soft compartments. Will obtain xray imaging given bimalleolar tenderness and swelling. Will give pain meds. Will reassess shortly.  7:10 PM Xray negative for fx. Will treat as sprain, given ASO brace and crutches. Discussed RICE therapy. Will give pain control. Discussed f/up with ortho in 1-2wks for ongoing symptoms. I explained the diagnosis and have given explicit precautions to return to the ER including for any other new or worsening symptoms. The patient understands and accepts the medical plan as it's been dictated and I have answered their questions. Discharge instructions concerning home care and prescriptions have been given. The patient is STABLE and is discharged to home in good condition.   I personally performed the services described in this documentation, which was scribed in my presence. The recorded information has been reviewed and is accurate.   BP 115/81 mmHg  Pulse 71  Temp(Src) 98.5 F (36.9 C) (Oral)  Resp 18  SpO2 100%  LMP 04/10/2015  Meds ordered this encounter  Medications  . HYDROcodone-acetaminophen (NORCO/VICODIN) 5-325 MG per tablet 1 tablet    Sig:   .  HYDROcodone-acetaminophen (NORCO) 5-325 MG per tablet    Sig: Take 1 tablet by mouth every 6 (six) hours as needed for severe pain.    Dispense:  6 tablet    Refill:  0    Order Specific Question:  Supervising Provider    Answer:  MILLER, BRIAN [[id number]]  . naproxen (NAPROSYN) 500 MG tablet    Sig: Take 1 tablet (500 mg total) by mouth 2 (two) times daily as needed for mild pain, moderate pain or headache (TAKE WITH MEALS.).    Dispense:  20 tablet    Refill:  0    Order Specific Question:  Supervising Provider    Answer:  Eber Hong [[id number]]     Alexandra Riendeau Camprubi-Soms, PA-C 04/10/15 1910  Azalia Bilis, MD 04/11/15 0025

## 2015-04-10 NOTE — Discharge Instructions (Signed)
Wear ankle brace for at least 2 weeks for stabilization of ankle. Use crutches as needed for comfort. Ice and elevate ankle throughout the day. Alternate between naprosyn and norco for pain relief. Do not drive or operate machinery with pain medication use. Call orthopedic follow up today or tomorrow to schedule followup appointment for recheck of ongoing ankle pain in 1-2 weeks that can be canceled with a 24-48 hour notice if complete resolution of pain. Return to the ER for changes or worsening symptoms.    Ankle Sprain An ankle sprain is an injury to the strong, fibrous tissues (ligaments) that hold the bones of your ankle joint together.  CAUSES An ankle sprain is usually caused by a fall or by twisting your ankle. Ankle sprains most commonly occur when you step on the outer edge of your foot, and your ankle turns inward. People who participate in sports are more prone to these types of injuries.  SYMPTOMS   Pain in your ankle. The pain may be present at rest or only when you are trying to stand or walk.  Swelling.  Bruising. Bruising may develop immediately or within 1 to 2 days after your injury.  Difficulty standing or walking, particularly when turning corners or changing directions. DIAGNOSIS  Your caregiver will ask you details about your injury and perform a physical exam of your ankle to determine if you have an ankle sprain. During the physical exam, your caregiver will press on and apply pressure to specific areas of your foot and ankle. Your caregiver will try to move your ankle in certain ways. An X-ray exam may be done to be sure a bone was not broken or a ligament did not separate from one of the bones in your ankle (avulsion fracture).  TREATMENT  Certain types of braces can help stabilize your ankle. Your caregiver can make a recommendation for this. Your caregiver may recommend the use of medicine for pain. If your sprain is severe, your caregiver may refer you to a surgeon  who helps to restore function to parts of your skeletal system (orthopedist) or a physical therapist. [place] ice to your injury for 1-2 days or as directed by your caregiver. Applying ice helps to reduce inflammation and pain.  Put ice in a plastic bag.  Place a towel between your skin and the bag.  Leave the ice on for 15-20 minutes at a time, every 2 hours while you are awake.  Only take over-the-counter or prescription medicines for pain, discomfort, or fever as directed by your caregiver.  Elevate your injured ankle above the level of your heart as much as possible for 2-3 days.  If your caregiver recommends crutches, use them as instructed. Gradually put weight on the affected ankle. Continue to use crutches or a cane until you can walk without feeling pain in your ankle.  If you have a plaster splint, wear the splint as directed by your caregiver. Do not rest it on anything harder than a pillow for the first 24 hours. Do not put weight on it. Do not get it wet. You may take it off to take a shower or bath.  You may have been given an elastic bandage to wear around your ankle to provide support. If the elastic bandage is too tight (you have numbness or tingling in your foot or your foot becomes cold and blue), adjust the bandage to make it comfortable.  If you have an air splint, you  may blow more air into it or let air out to make it more comfortable. You may take your splint off at night and before taking a shower or bath. Wiggle your toes in the splint several times per day to decrease swelling. °SEEK MEDICAL CARE IF:  °· You have rapidly increasing bruising or swelling. °· Your toes feel extremely cold or you lose feeling in your foot. °· Your pain is not relieved with medicine. °SEEK IMMEDIATE MEDICAL CARE IF: °· Your toes are numb or blue. °· You have severe pain that is increasing. °MAKE SURE YOU:  °· Understand these instructions. °· Will watch your  condition. °· Will get help right away if you are not doing well or get worse. °Document Released: 09/14/2005 Document Revised: 06/08/2012 Document Reviewed: 09/26/2011 °[employer]® Patient Information ©2015 [employer]. This information is not intended to replace advice given to you by your health care provider. Make sure you discuss any questions you have with your health care provider. ° °Cryotherapy °Cryotherapy means treatment with cold. Ice or gel packs can be used to reduce both pain and swelling. Ice is the most helpful within the first 24 to 48 hours after an injury or flare-up from overusing a muscle or joint. Sprains, strains, spasms, burning pain, shooting pain, and aches can all be eased with ice. Ice can also be used when recovering from surgery. Ice is effective, has very few side effects, and is safe for most people to use. °PRECAUTIONS  °Ice is not a safe treatment option for people with: °· Raynaud phenomenon. This is a condition affecting small blood vessels in the extremities. Exposure to cold may cause your problems to return. °· Cold hypersensitivity. There are many forms of cold hypersensitivity, including: °¨ Cold urticaria. Red, itchy hives appear on the skin when the tissues begin to warm after being iced. °¨ Cold erythema. This is a red, itchy rash caused by exposure to cold. °¨ Cold hemoglobinuria. Red blood cells break down when the tissues begin to warm after being iced. The hemoglobin that carry oxygen are passed into the urine because they cannot combine with blood proteins fast enough. °· Numbness or altered sensitivity in the area being iced. °If you have any of the following conditions, do not use ice until you have discussed cryotherapy with your caregiver: °· Heart conditions, such as arrhythmia, angina, or chronic heart disease. °· High blood pressure. °· Healing wounds or open skin in the area being iced. °· Current infections. °· Rheumatoid arthritis. °· Poor  circulation. °· Diabetes. °Ice slows the blood flow in the region it is applied. This is beneficial when trying to stop inflamed tissues from spreading irritating chemicals to surrounding tissues. However, if you expose your skin to cold temperatures for too long or without the proper protection, you can damage your skin or nerves. Watch for signs of skin damage due to cold. °HOME CARE INSTRUCTIONS °Follow these tips to use ice and cold packs safely. °· Place a dry or damp towel between the ice and skin. A damp towel will cool the skin more quickly, so you may need to shorten the time that the ice is used. °· For a more rapid response, add gentle compression to the ice. °· Ice for no more than 10 to 20 minutes at a time. The bonier the area you are icing, the less time it will take to get the benefits of ice. °· Check your skin after 5 minutes to make sure there are no   signs of a poor response to cold or skin damage.  Rest 20 minutes or more between uses.  Once your skin is numb, you can end your treatment. You can test numbness by very lightly touching your skin. The touch should be so light that you do not see the skin dimple from the pressure of your fingertip. When using ice, most people will feel these normal sensations in this order: cold, burning, aching, and numbness.  Do not use ice on someone who cannot communicate their responses to pain, such as small children or people with dementia. HOW TO MAKE AN ICE PACK Ice packs are the most common way to use ice therapy. Other methods include ice massage, ice baths, and cryosprays. Muscle creams that cause a cold, tingly feeling do not offer the same benefits that ice offers and should not be used as a substitute unless recommended by your caregiver. To make an ice pack, do one of the following:  Place crushed ice or a bag of frozen vegetables in a sealable plastic bag. Squeeze out the excess air. Place this bag inside another plastic bag. Slide the bag  into a pillowcase or place a damp towel between your skin and the bag.  Mix 3 parts water with 1 part rubbing alcohol. Freeze the mixture in a sealable plastic bag. When you remove the mixture from the freezer, it will be slushy. Squeeze out the excess air. Place this bag inside another plastic bag. Slide the bag into a pillowcase or place a damp towel between your skin and the bag. SEEK MEDICAL CARE IF:  You develop white spots on your skin. This may give the skin a blotchy (mottled) appearance.  Your skin turns blue or pale.  Your skin becomes waxy or hard.  Your swelling gets worse. MAKE SURE YOU:   Understand these instructions.  Will watch your condition.  Will get help right away if you are not doing well or get worse. Document Released: 05/11/2011 Document Revised: 01/29/2014 Document Reviewed: 05/11/2011 [facility] Patient Information 2015 [place], [place]. This information is not intended to replace advice given to you by your health care provider. Make sure you discuss any questions you have with your health care provider.

## 2015-04-10 NOTE — ED Notes (Signed)
Pt to radiology.

## 2016-10-25 DIAGNOSIS — R87619 Unspecified abnormal cytological findings in specimens from cervix uteri: Secondary | ICD-10-CM | POA: Insufficient documentation

## 2019-10-21 DIAGNOSIS — L700 Acne vulgaris: Secondary | ICD-10-CM | POA: Insufficient documentation

## 2020-02-22 DIAGNOSIS — K219 Gastro-esophageal reflux disease without esophagitis: Secondary | ICD-10-CM | POA: Insufficient documentation

## 2020-02-22 DIAGNOSIS — O210 Mild hyperemesis gravidarum: Secondary | ICD-10-CM | POA: Insufficient documentation

## 2020-02-22 DIAGNOSIS — I499 Cardiac arrhythmia, unspecified: Secondary | ICD-10-CM | POA: Insufficient documentation

## 2020-07-21 ENCOUNTER — Emergency Department (HOSPITAL_COMMUNITY): Payer: Medicaid Other

## 2020-07-21 ENCOUNTER — Inpatient Hospital Stay (HOSPITAL_COMMUNITY)
Admission: EM | Admit: 2020-07-21 | Discharge: 2020-07-25 | DRG: 831 | Disposition: A | Discharge status: Home / Self Care | Payer: Medicaid Other | Attending: Obstetrics and Gynecology | Admitting: Obstetrics and Gynecology

## 2020-07-21 ENCOUNTER — Inpatient Hospital Stay (HOSPITAL_COMMUNITY): Payer: Medicaid Other

## 2020-07-21 ENCOUNTER — Encounter (HOSPITAL_COMMUNITY): Payer: Self-pay | Admitting: Emergency Medicine

## 2020-07-21 ENCOUNTER — Other Ambulatory Visit: Payer: Self-pay

## 2020-07-21 DIAGNOSIS — O99513 Diseases of the respiratory system complicating pregnancy, third trimester: Secondary | ICD-10-CM | POA: Diagnosis present

## 2020-07-21 DIAGNOSIS — O98513 Other viral diseases complicating pregnancy, third trimester: Principal | ICD-10-CM | POA: Diagnosis present

## 2020-07-21 DIAGNOSIS — E669 Obesity, unspecified: Secondary | ICD-10-CM | POA: Diagnosis present

## 2020-07-21 DIAGNOSIS — R52 Pain, unspecified: Secondary | ICD-10-CM | POA: Diagnosis not present

## 2020-07-21 DIAGNOSIS — M7989 Other specified soft tissue disorders: Secondary | ICD-10-CM | POA: Diagnosis not present

## 2020-07-21 DIAGNOSIS — Z3A29 29 weeks gestation of pregnancy: Secondary | ICD-10-CM | POA: Diagnosis not present

## 2020-07-21 DIAGNOSIS — O99214 Obesity complicating childbirth: Secondary | ICD-10-CM | POA: Diagnosis present

## 2020-07-21 DIAGNOSIS — K219 Gastro-esophageal reflux disease without esophagitis: Secondary | ICD-10-CM | POA: Diagnosis present

## 2020-07-21 DIAGNOSIS — U071 COVID-19: Secondary | ICD-10-CM | POA: Diagnosis present

## 2020-07-21 DIAGNOSIS — J1282 Pneumonia due to coronavirus disease 2019: Secondary | ICD-10-CM | POA: Diagnosis present

## 2020-07-21 DIAGNOSIS — R7989 Other specified abnormal findings of blood chemistry: Secondary | ICD-10-CM | POA: Diagnosis not present

## 2020-07-21 DIAGNOSIS — R0602 Shortness of breath: Secondary | ICD-10-CM | POA: Diagnosis present

## 2020-07-21 DIAGNOSIS — O99613 Diseases of the digestive system complicating pregnancy, third trimester: Secondary | ICD-10-CM | POA: Diagnosis present

## 2020-07-21 HISTORY — DX: COVID-19: U07.1

## 2020-07-21 LAB — LACTIC ACID, PLASMA: Lactic Acid, Venous: 0.8 mmol/L (ref 0.5–1.9)

## 2020-07-21 LAB — COMPREHENSIVE METABOLIC PANEL
ALT: 23 U/L (ref 0–44)
AST: 24 U/L (ref 15–41)
Albumin: 2.6 g/dL — ABNORMAL LOW (ref 3.5–5.0)
Alkaline Phosphatase: 73 U/L (ref 38–126)
Anion gap: 11 (ref 5–15)
BUN: <5 mg/dL — ABNORMAL LOW (ref 6–20)
CO2: 19 mmol/L — ABNORMAL LOW (ref 22–32)
Calcium: 8.2 mg/dL — ABNORMAL LOW (ref 8.9–10.3)
Chloride: 104 mmol/L (ref 98–111)
Creatinine, Ser: 0.62 mg/dL (ref 0.44–1.00)
GFR, Estimated: >60 mL/min (ref >60)
Glucose, Bld: 85 mg/dL (ref 70–99)
Potassium: 3.6 mmol/L (ref 3.5–5.1)
Sodium: 134 mmol/L — ABNORMAL LOW (ref 135–145)
Total Bilirubin: 1.1 mg/dL (ref 0.3–1.2)
Total Protein: 6.4 g/dL — ABNORMAL LOW (ref 6.5–8.1)

## 2020-07-21 LAB — CBC
HCT: 37.1 % (ref 36.0–46.0)
Hemoglobin: 12.4 g/dL (ref 12.0–15.0)
MCH: 31.4 pg (ref 26.0–34.0)
MCHC: 33.4 g/dL (ref 30.0–36.0)
MCV: 93.9 fL (ref 80.0–100.0)
Platelets: 163 10*3/uL (ref 150–400)
RBC: 3.95 MIL/uL (ref 3.87–5.11)
RDW: 12 % (ref 11.5–15.5)
WBC: 7 10*3/uL (ref 4.0–10.5)
nRBC: 0 % (ref 0.0–0.2)

## 2020-07-21 LAB — FIBRINOGEN: Fibrinogen: 558 mg/dL — ABNORMAL HIGH (ref 210–475)

## 2020-07-21 LAB — BASIC METABOLIC PANEL
Anion gap: 10 (ref 5–15)
BUN: 5 mg/dL — ABNORMAL LOW (ref 6–20)
CO2: 19 mmol/L — ABNORMAL LOW (ref 22–32)
Calcium: 8.4 mg/dL — ABNORMAL LOW (ref 8.9–10.3)
Chloride: 102 mmol/L (ref 98–111)
Creatinine, Ser: 0.62 mg/dL (ref 0.44–1.00)
GFR, Estimated: >60 mL/min (ref >60)
Glucose, Bld: 102 mg/dL — ABNORMAL HIGH (ref 70–99)
Potassium: 3.7 mmol/L (ref 3.5–5.1)
Sodium: 131 mmol/L — ABNORMAL LOW (ref 135–145)

## 2020-07-21 LAB — PROCALCITONIN: Procalcitonin: 0.18 ng/mL

## 2020-07-21 LAB — RESPIRATORY PANEL BY RT PCR (FLU A&B, COVID)
Influenza A by PCR: NEGATIVE
Influenza B by PCR: NEGATIVE
SARS Coronavirus 2 by RT PCR: POSITIVE — AB

## 2020-07-21 LAB — LACTATE DEHYDROGENASE: LDH: 171 U/L (ref 98–192)

## 2020-07-21 LAB — TYPE AND SCREEN
ABO/RH(D): O POS
Antibody Screen: NEGATIVE

## 2020-07-21 LAB — I-STAT BETA HCG BLOOD, ED (MC, WL, AP ONLY): I-stat hCG, quantitative: >2000 m[IU]/mL — ABNORMAL HIGH (ref <5)

## 2020-07-21 LAB — TROPONIN I (HIGH SENSITIVITY)
Troponin I (High Sensitivity): 4 ng/L (ref <18)
Troponin I (High Sensitivity): 4 ng/L (ref <18)

## 2020-07-21 LAB — GLUCOSE, CAPILLARY: Glucose-Capillary: 97 mg/dL (ref 70–99)

## 2020-07-21 LAB — FERRITIN: Ferritin: 171 ng/mL (ref 11–307)

## 2020-07-21 LAB — D-DIMER, QUANTITATIVE: D-Dimer, Quant: 3 ug/mL-FEU — ABNORMAL HIGH (ref 0.00–0.50)

## 2020-07-21 LAB — TRIGLYCERIDES: Triglycerides: 135 mg/dL (ref <150)

## 2020-07-21 LAB — C-REACTIVE PROTEIN: CRP: 2.8 mg/dL — ABNORMAL HIGH (ref <1.0)

## 2020-07-21 MED ORDER — ZOLPIDEM TARTRATE 5 MG PO TABS
5.0000 mg | ORAL_TABLET | Freq: Every evening | ORAL | Status: DC | PRN
  Administered 2020-07-22 – 2020-07-23 (×2): 5 mg via ORAL
  Filled 2020-07-21 (×2): qty 1

## 2020-07-21 MED ORDER — GUAIFENESIN-DM 100-10 MG/5ML PO SYRP
5.0000 mL | ORAL_SOLUTION | ORAL | Status: DC | PRN
  Administered 2020-07-21 – 2020-07-25 (×9): 5 mL via ORAL
  Filled 2020-07-21 (×9): qty 5

## 2020-07-21 MED ORDER — DOCUSATE SODIUM 100 MG PO CAPS
100.0000 mg | ORAL_CAPSULE | Freq: Every day | ORAL | Status: DC
  Administered 2020-07-21 – 2020-07-24 (×4): 100 mg via ORAL
  Filled 2020-07-21 (×6): qty 1

## 2020-07-21 MED ORDER — SODIUM CHLORIDE 0.9 % IV SOLN: 100.0000 mg | INTRAVENOUS | Status: AC

## 2020-07-21 MED ORDER — ENOXAPARIN SODIUM 40 MG/0.4ML ~~LOC~~ SOLN
40.0000 mg | SUBCUTANEOUS | Status: DC
  Administered 2020-07-21 – 2020-07-22 (×2): 40 mg via SUBCUTANEOUS
  Filled 2020-07-21 (×2): qty 0.4

## 2020-07-21 MED ORDER — PANTOPRAZOLE SODIUM 40 MG PO TBEC
40.0000 mg | DELAYED_RELEASE_TABLET | Freq: Every day | ORAL | Status: DC
  Administered 2020-07-21 – 2020-07-25 (×5): 40 mg via ORAL
  Filled 2020-07-21 (×5): qty 1

## 2020-07-21 MED ORDER — ACETAMINOPHEN 325 MG PO TABS
650.0000 mg | ORAL_TABLET | ORAL | Status: DC | PRN
  Administered 2020-07-21 – 2020-07-24 (×4): 650 mg via ORAL
  Filled 2020-07-21 (×4): qty 2

## 2020-07-21 MED ORDER — PRENATAL MULTIVITAMIN CH: 1.0000 | ORAL_TABLET | Freq: Every day | ORAL | Status: DC

## 2020-07-21 MED ORDER — SODIUM CHLORIDE 0.9 % IV SOLN
100.0000 mg | INTRAVENOUS | Status: AC
  Administered 2020-07-21 (×2): 100 mg via INTRAVENOUS
  Filled 2020-07-21 (×2): qty 20

## 2020-07-21 MED ORDER — PRENATAL MULTIVITAMIN CH
1.0000 | ORAL_TABLET | Freq: Every day | ORAL | Status: DC
  Administered 2020-07-21 – 2020-07-24 (×4): 1 via ORAL
  Filled 2020-07-21 (×5): qty 1

## 2020-07-21 MED ORDER — SODIUM CHLORIDE 0.9 % IV SOLN
100.0000 mg | Freq: Every day | INTRAVENOUS | Status: AC
  Administered 2020-07-22 – 2020-07-25 (×4): 100 mg via INTRAVENOUS
  Filled 2020-07-21 (×4): qty 20

## 2020-07-21 MED ORDER — CALCIUM CARBONATE ANTACID 500 MG PO CHEW: 2.0000 | CHEWABLE_TABLET | ORAL | Status: DC | PRN

## 2020-07-21 MED ORDER — SODIUM CHLORIDE 0.9 % IV BOLUS
1000.0000 mL | Freq: Once | INTRAVENOUS | Status: AC
  Administered 2020-07-21: 1000 mL via INTRAVENOUS

## 2020-07-21 NOTE — Progress Notes (Addendum)
Pt has had contractions ranging from 6-9 minutes apart since arrival to the unit. Charge RN is aware, pt receiving 1L bolus per OB, tylenol and heat packs also given at this time. Pt is aware to notify nursing staff whenever contractions come. This RN remains with pt until fetal monitoring can be placed.  Contractions: [id number] [id number]  Pt has also downloaded contraction counter to cell phone.

## 2020-07-21 NOTE — Progress Notes (Signed)
This nurse made Page, [facility], aware of the current contraction-like episodes of the newly admitted patient. She assured me that the on-coming [facility] would be made aware, and she forwarded this nurse to the [facility]-RN.  The OBGYN RRRN was currently giving hand off report to [facility], the daytime RRRN.  Corrie Dandy will be on the floor shortly to assess the patient.  Will continue to monitor the patient.

## 2020-07-21 NOTE — Progress Notes (Signed)
Patient has been on continuous monitoring since arrival to [facility] specialty care. Cat 1 tracing especially given EGA. Toco rare [id number], patient currently sleeping through. Can D/C TOCO at this time, notfy with any changes.

## 2020-07-21 NOTE — Progress Notes (Signed)
PROGRESS NOTE                                                                             PROGRESS NOTE                                                                                                                                                                                                             Patient Demographics:    Alexandra Davidson, is a 34 y.o. female, DOB - 1986/08/14, ZOX:[id number]  Outpatient Primary MD for the patient is [facility]    LOS - 0  Admit date - 07/21/2020    Chief Complaint  Patient presents with  . Shortness of Breath       Brief Narrative    This is a no charge note was patient was seen earlier today as medical consult by Dr. Medical consult by Dr. Sedalia Muta Amy earlier today as well, chart imaging and labs were reviewed  Alexandra Davidson is a 34 y.o. female with medical history significant for third trimester pregnancy presents with chief concern of cough and worsening shortness of breath and chest pain.  She reports that she started coughing on Monday, 07/15/2020.  She reports the cough is nonproductive except for a couple times today where it was thick and green/yellow.  She denies fever and chills and nausea.  She endorses 2 episodes of vomiting on day of presentation and states the vomitus is negative for blood.  On further questioning regarding chest pain, she states that the pain is worse when she coughs and with palpation.  It started approximately Thursday, 07/18/2020.  She endorses that sometimes the pain is felt during inhalation as well.  She reports that she suspects she got infected with COVID-19 from her son.  She reports that her sons school there was a boy that tested positive for Covid.  She reports that she has not been vaccinated for COVID-19 and that her son is not old enough to be vaccinated.  At bedside Alexandra Davidson does  not appear to be in acute distress and  able to converse in full sentences without use of accessory respiratory muscles.  Social history: She lives at home, denies any tobacco use, previously before pregnancy she infrequently drinks and is currently not drinking due to pregnancy.  Denies history or current recreational drug use.  ED Course: Discussed with ED provider patient tested positive for COVID-19 approximately 2 days ago and now is complaining of shortness of breath.  ED provider will contact OB/GYN service for admission as patient is in third trimester of pregnancy   Subjective:    Alexandra Davidson today she reports cough, dyspnea, and chest pain related to cough.  .   Assessment  & Plan :    Active Problems:   COVID-19 affecting pregnancy in third trimester  COVID-19 infection -She is unvaccinated, x-ray significant for right basal opacity, direct abscess versus COVID-19 of pneumonia, for now continue with remdesivir to finish total of 5 days, LFTs within normal range, first dose to be given today, continue to trend LFTs daily, she is not hypoxic, no indication for steroids at this point, given she is symptomatic she is not a candidate for monoclonal antibody, she was encouraged to use incentive spirometer, out of bed to chair as well. -Continue to trend inflammatory markers  Chest pain -Musculoskeletal, reproducible, related to cough.  Elevated D-dimers -This is most likely due to inflammation in the setting of COVID-19 infection, venous Dopplers are negative for DVT , she is not hypoxic or tachycardic, low clinical suspicion for PE, no need to expose the patient for CTA chest with such low clinical suspicion, she will be kept on DVT prophylaxis dose 40 mg subcu daily, will trend daily,  SpO2: 100 %  Recent Labs  Lab 07/21/20 0111 07/21/20 0345 07/21/20 0410 07/21/20 0414 07/21/20 1148  WBC 7.0  --   --   --   --   PLT 163  --   --   --   --   CRP  --  2.8*  --   --   --   DDIMER  --  3.00*  --   --   --    PROCALCITON  --  0.18  --   --   --   AST  --   --   --   --  24  ALT  --   --   --   --  23  ALKPHOS  --   --   --   --  73  BILITOT  --   --   --   --  1.1  ALBUMIN  --   --   --   --  2.6*  LATICACIDVEN  --   --  0.8  --   --   SARSCOV2NAA  --   --   --  POSITIVE*  --        ABG  No results found for: PHART, PCO2ART, PO2ART, HCO3, TCO2, ACIDBASEDEF, O2SAT     Code Status :  Full  Consults  :  OB is primary  Procedures  :  none  DVT Prophylaxis  :  Lovenox  -Thanks for this medical consult, will continue to follow, please any question contact us via my own.  Lab Results  Component Value Date   PLT 163 07/21/2020    Diet :  Diet Order            Diet regular Room service appropriate? Yes; Fluid consistency: Thin  Diet effective now                  Inpatient Medications  Scheduled Meds: . docusate sodium  100 mg Oral Daily  . enoxaparin (LOVENOX) injection  40 mg Subcutaneous Q24H  . pantoprazole  40 mg Oral Daily  . prenatal multivitamin  1 tablet Oral Q1200   Continuous Infusions: . [START ON 07/22/2020] remdesivir 100 mg in NS 100 mL     PRN Meds:.acetaminophen, calcium carbonate, guaiFENesin-dextromethorphan, zolpidem  Antibiotics  :    Anti-infectives (From admission, onward)   Start     Dose/Rate Route Frequency Ordered Stop   07/22/20 1000  remdesivir 100 mg in sodium chloride 0.9 % 100 mL IVPB       "Followed by" Linked Group Details   100 mg 200 mL/hr over 30 Minutes Intravenous Daily 07/21/20 0643 07/26/20 0959   07/21/20 0730  remdesivir 100 mg in sodium chloride 0.9 % 100 mL IVPB       "Followed by" Linked Group Details   100 mg 200 mL/hr over 30 Minutes Intravenous Every 30 min 07/21/20 0643 07/21/20 0829          Mliss Fritzawood Laverda Stribling M.D on 07/21/2020 at 1:41 PM  To page go to [email or web address]   [facility] -  Office  [phone]    Objective:   Vitals:   07/21/20 1120 07/21/20 1125 07/21/20 1130 07/21/20 1208   BP:    104/69  Pulse:    (!) 108  Resp:    18  Temp:    98.2 F (36.8 C)  TempSrc:    Oral  SpO2: 99% 99% 99% 100%    Wt Readings from Last 3 Encounters:  09/30/12 99.6 kg  08/31/12 98.2 kg  08/15/12 100 kg    No intake or output data in the 24 hours ending 07/21/20 1341   Physical Exam  Awake Alert, No new F.N deficits, Normal affecty, CTAB RRR,No Gallops,Rubs or new Murmurs, No Parasternal Heave No Cyanosis, Clubbing or edema, No new Rash or bruise      Data Review:    CBC Recent Labs  Lab 07/21/20 0111  WBC 7.0  HGB 12.4  HCT 37.1  PLT 163  MCV 93.9  MCH 31.4  MCHC 33.4  RDW 12.0    Recent Labs  Lab 07/21/20 0111 07/21/20 0345 07/21/20 0410 07/21/20 1148  NA 131*  --   --  134*  K 3.7  --   --  3.6  CL 102  --   --  104  CO2 19*  --   --  19*  GLUCOSE 102*  --   --  85  BUN 5*  --   --  <5*  CREATININE 0.62  --   --  0.62  CALCIUM 8.4*  --   --  8.2*  AST  --   --   --  24  ALT  --   --   --  23  ALKPHOS  --   --   --  73  BILITOT  --   --   --  1.1  ALBUMIN  --   --   --  2.6*  CRP  --  2.8*  --   --   DDIMER  --  3.00*  --   --   PROCALCITON  --  0.18  --   --   LATICACIDVEN  --   --  0.8  --     ------------------------------------------------------------------------------------------------------------------  Recent Labs    07/21/20 0345  TRIG 135    No results found for: HGBA1C ------------------------------------------------------------------------------------------------------------------ No results for input(s): TSH, T4TOTAL, T3FREE, THYROIDAB in the last 72 hours.  Invalid input(s): FREET3  Cardiac Enzymes No results for input(s): CKMB, TROPONINI, MYOGLOBIN in the last 168 hours.  Invalid input(s): CK ------------------------------------------------------------------------------------------------------------------ No results found for: BNP  Micro Results Recent Results (from the past 240 hour(s))  Blood Culture  (routine x 2)     Status: None (Preliminary result)   Collection Time: 07/21/20  4:10 AM   Specimen: BLOOD  Result Value Ref Range Status   Specimen Description BLOOD RIGHT HAND  Final   Special Requests   Final    BOTTLES DRAWN AEROBIC AND ANAEROBIC Blood Culture results may not be optimal due to an inadequate volume of blood received in culture bottles   Culture   Final    NO GROWTH < 12 HOURS Performed at [facility], [address], [place], [place] [address]    Report Status PENDING  Incomplete  Blood Culture (routine x 2)     Status: None (Preliminary result)   Collection Time: 07/21/20  4:12 AM   Specimen: BLOOD  Result Value Ref Range Status   Specimen Description BLOOD LEFT ARM  Final   Special Requests   Final    BOTTLES DRAWN AEROBIC AND ANAEROBIC Blood Culture adequate volume   Culture   Final    NO GROWTH < 12 HOURS Performed at [facility], [address] [place], [place] [address]    Report Status PENDING  Incomplete  Respiratory Panel by RT PCR (Flu A&B, Covid) - Nasopharyngeal Swab     Status: Abnormal   Collection Time: 07/21/20  4:14 AM   Specimen: Nasopharyngeal Swab  Result Value Ref Range Status   SARS Coronavirus 2 by RT PCR POSITIVE (A) NEGATIVE Final    Comment: RESULT CALLED TO, READ BACK BY AND VERIFIED WITH: Mardene Celeste 0981 07/21/2020 T. TYSOR (NOTE) SARS-CoV-2 target nucleic acids are DETECTED.  SARS-CoV-2 RNA is generally detectable in upper respiratory specimens  during the acute phase of infection. Positive results are indicative of the presence of the identified virus, but do not rule out bacterial infection or co-infection with other pathogens not detected by the test. Clinical correlation with patient history and other diagnostic information is necessary to determine patient infection status. The expected result is Negative.  Fact Sheet for Patients:  https://www.moore.com/  Fact Sheet for  Healthcare Providers: https://www.young.biz/  This test is not yet approved or cleared by the [place] FDA and  has been authorized for detection and/or diagnosis of SARS-CoV-2 by FDA under an Emergency Use Authorization (EUA).  This EUA will remain in effect (meaning this test c an be used) for the duration of  the COVID-19 declaration under Section 564(b)(1) of the Act, 21 U.S.C. section 360bbb-3(b)(1), unless the authorization is terminated or revoked sooner.      Influenza A by PCR NEGATIVE NEGATIVE Final   Influenza B by PCR NEGATIVE NEGATIVE Final    Comment: (NOTE) The Xpert Xpress SARS-CoV-2/FLU/RSV assay is intended as an aid in  the diagnosis of influenza from Nasopharyngeal swab specimens and  should not be used as a sole basis for treatment. Nasal washings and  aspirates are unacceptable for Xpert Xpress SARS-CoV-2/FLU/RSV  testing.  Fact Sheet for Patients: https://www.moore.com/  Fact Sheet for Healthcare Providers: https://www.young.biz/  This test is not yet approved or cleared by the  [place] [employer] and  has been authorized for detection and/or diagnosis of SARS-CoV-2 by  FDA under an TEFL teacher (EUA). This EUA will remain  in effect (meaning this test can be used) for the duration of the  Covid-19 declaration under Section 564(b)(1) of the Act, 21  U.S.C. section 360bbb-3(b)(1), unless the authorization is  terminated or revoked. Performed at [facility], [address] [place], [place] [address]     Radiology Reports DG Chest [place] 1 View  Result Date: 07/21/2020 CLINICAL DATA:  COVID-19 EXAM: PORTABLE CHEST 1 VIEW COMPARISON:  None. FINDINGS: Mild right basilar opacity. Normal pleural spaces and cardiomediastinal contours. IMPRESSION: Mild right basilar opacity may indicate atelectasis or infection. Electronically Signed   By: Deatra Robinson M.D.   On: 07/21/2020  03:10   VAS [place] LOWER EXTREMITY VENOUS (DVT)  Result Date: 07/21/2020  Lower Venous DVTStudy Indications: Pain, and Swelling.  Risk Factors: Patient pregnant. Performing Technologist: Jannet Askew RCT RDMS  Examination Guidelines: A complete evaluation includes B-mode imaging, spectral Doppler, color Doppler, and power Doppler as needed of all accessible portions of each vessel. Bilateral testing is considered an integral part of a complete examination. Limited examinations for reoccurring indications may be performed as noted. The reflux portion of the exam is performed with the patient in reverse Trendelenburg.  +---------+---------------+---------+-----------+----------+--------------+ RIGHT    CompressibilityPhasicitySpontaneityPropertiesThrombus Aging +---------+---------------+---------+-----------+----------+--------------+ CFV      Full           Yes      Yes                                 +---------+---------------+---------+-----------+----------+--------------+ SFJ      Full                                                        +---------+---------------+---------+-----------+----------+--------------+ FV Prox  Full                                                        +---------+---------------+---------+-----------+----------+--------------+ FV Mid   Full                                                        +---------+---------------+---------+-----------+----------+--------------+ FV DistalFull                                                        +---------+---------------+---------+-----------+----------+--------------+ PFV      Full                                                        +---------+---------------+---------+-----------+----------+--------------+  POP      Full           Yes      Yes                                 +---------+---------------+---------+-----------+----------+--------------+ PTV      Full                                                         +---------+---------------+---------+-----------+----------+--------------+ PERO     Full                                                        +---------+---------------+---------+-----------+----------+--------------+   +---------+---------------+---------+-----------+----------+--------------+ LEFT     CompressibilityPhasicitySpontaneityPropertiesThrombus Aging +---------+---------------+---------+-----------+----------+--------------+ CFV      Full           Yes      Yes                                 +---------+---------------+---------+-----------+----------+--------------+ SFJ      Full                                                        +---------+---------------+---------+-----------+----------+--------------+ FV Prox  Full                                                        +---------+---------------+---------+-----------+----------+--------------+ FV Mid   Full                                                        +---------+---------------+---------+-----------+----------+--------------+ FV DistalFull                                                        +---------+---------------+---------+-----------+----------+--------------+ PFV      Full                                                        +---------+---------------+---------+-----------+----------+--------------+ POP      Full           Yes      Yes                                 +---------+---------------+---------+-----------+----------+--------------+  PTV      Full                                                        +---------+---------------+---------+-----------+----------+--------------+ PERO     Full                                                        +---------+---------------+---------+-----------+----------+--------------+     Summary: RIGHT: - There is no evidence of deep vein thrombosis in the  lower extremity.  - No cystic structure found in the popliteal fossa.  LEFT: - There is no evidence of deep vein thrombosis in the lower extremity.  - No cystic structure found in the popliteal fossa.  *See table(s) above for measurements and observations.    Preliminary

## 2020-07-21 NOTE — Progress Notes (Signed)
This nurse called Dr. Reina Fuse about the patient complaining of regularly timed abdominal tightness/discomfort.  These episodes then began to be timed and are running anywhere between seven to nine minutes apart since admission to the floor.  Dr. Reina Fuse believes it could be due to dehydration.  Orders were given for administration of oral tylenol (given), heat packs (given), and a one liter bolus of NS which has been started.

## 2020-07-21 NOTE — ED Triage Notes (Signed)
Pt c/o worsening shob, cough with thick sputum. 8 mo preg, [facility] 12/29, pt tested Covid + 3 d ago.

## 2020-07-21 NOTE — Progress Notes (Signed)
   07/21/20 0550  Assess: MEWS Score  Temp 98.5 F (36.9 C)  BP 109/79  Pulse Rate (!) 113  ECG Heart Rate (!) 115  Resp 19  Level of Consciousness Alert  SpO2 95 %  O2 Device Room Air  Patient Activity (if Appropriate) In bed  Assess: MEWS Score  MEWS Temp 0  MEWS Systolic 0  MEWS Pulse 2  MEWS RR 0  MEWS LOC 0  MEWS Score 2  MEWS Score Color Yellow  Assess: if the MEWS score is Yellow or Red  Were vital signs taken at a resting state? Yes  Focused Assessment No change from prior assessment  Early Detection of Sepsis Score *See Row Information* Low  MEWS guidelines implemented *See Row Information* Yes  Treat  MEWS Interventions Escalated (See documentation below)  Take Vital Signs  Increase Vital Sign Frequency  Yellow: Q 2hr X 2 then Q 4hr X 2, if remains yellow, continue Q 4hrs  Escalate  MEWS: Escalate Yellow: discuss with charge nurse/RN and consider discussing with provider and RRT  Notify: Charge Nurse/RN  Name of Charge Nurse/RN Notified Nikki   Date Charge Nurse/RN Notified 07/21/20  Time Charge Nurse/RN Notified 0550  Notify: Provider  Provider Name/Title Shivaji, MD  Date Provider Notified 07/21/20  Time Provider Notified [phone]  Notification Type Call  Notification Reason Change in status;Other (Comment) (yellow MEWs/Contractions)  Response See new orders  Date of Provider Response 07/21/20  Time of Provider Response [phone]  Notify: Rapid Response  Name of Rapid Response RN Notified  (OBGYN RR RN)  Date Rapid Response Notified 07/21/20  Time Rapid Response Notified 0645

## 2020-07-21 NOTE — H&P (Signed)
Alexandra Davidson is a 34 y.o. female presenting to [facility] ED with worsening SOB, cough with increasing sputum. +FM, denies VB, LOF, CTX. Notes that "sides hurt' but also endorses increasing coughing. Patient COVID pos 07/16/20 per clinic chart, seen on 10/18 for eval of decreased FM (WNL). Family members positive per patient about two days prior.   PNC c/b obesity, GERD on Protonix, cardiac arrhythmia s/p eval in Feb 2021.  OB History    Gravida  8   Para  3   Term  2   Preterm  1   AB  4   Living  3     SAB      TAB  3   Ectopic  1   Multiple      Live Births  3          Past Medical History:  Diagnosis Date  . Abnormal Pap smear    lsil  . COVID-19   . Ectopic pregnancy   . Preterm labor    Past Surgical History:  Procedure Laterality Date  . DILATION AND CURETTAGE OF UTERUS    . LAPAROSCOPY  08/21/2011   Procedure: LAPAROSCOPY OPERATIVE;  Surgeon: Scheryl Darter, MD;  Location: [facility];  Service: Gynecology;  Laterality: N/A;  Operative Laparoscopy with Left Partial Salpingectomy   Family History: family history includes Cancer in her paternal uncle; Diabetes in her maternal grandmother. Social History:  reports that she has never smoked. She has never used smokeless tobacco. She reports that she does not drink alcohol and does not use drugs.     Maternal Diabetes: No1hr 119 Genetic Screening: Normal Maternal Ultrasounds/Referrals: Normal Fetal Ultrasounds or other Referrals:  None Maternal Substance Abuse:  No Significant Maternal Medications:  Meds include: Protonix Significant Maternal Lab Results:  None Other Comments:  None  Review of Systems  Constitutional: Positive for fatigue and fever. Negative for chills and diaphoresis.  HENT: Positive for congestion and rhinorrhea. Negative for sneezing.   Eyes: Negative.   Respiratory: Positive for cough and shortness of breath. Negative for chest tightness.   Cardiovascular: Negative for chest pain and leg  swelling.  Gastrointestinal: Positive for diarrhea, nausea and vomiting. Negative for abdominal pain and blood in stool.  Genitourinary: Negative for difficulty urinating, flank pain, frequency and hematuria.  Musculoskeletal: Positive for myalgias. Negative for arthralgias and back pain.  Skin: Negative for rash.  Neurological: Positive for light-headedness and headaches. Negative for dizziness, speech difficulty, weakness and numbness.    Blood pressure (!) 148/80, pulse (!) 118, temperature 97.9 F (36.6 C), temperature source Oral, resp. rate (!) 24, SpO2 96 %. Exam Physical Exam  Physical Exam Constitutional:      Appearance: She is well-developed.  HENT:     Head: Normocephalic and atraumatic.  Eyes:     Pupils: Pupils are equal, round, and reactive to light.  Cardiovascular:     Rate and Rhythm: Regular rhythm. Tachycardia present.     Heart sounds: Normal heart sounds.  Pulmonary:     Effort: Pulmonary effort is normal. Tachypnea present. No respiratory distress.     Breath sounds: Normal breath sounds. No wheezing or rales.  Chest:     Chest wall: No tenderness.  Abdominal:     General: Bowel sounds are normal.     Palpations: Abdomen is soft.     Tenderness: There is no abdominal tenderness. There is no guarding or rebound.  Musculoskeletal:        General: Normal range  of motion.     Cervical back: Normal range of motion and neck supple.  Lymphadenopathy:     Cervical: No cervical adenopathy.  Skin:    General: Skin is warm and dry.     Findings: No rash.  Neurological:     Mental Status: She is alert and oriented to person, place, and time.   Prenatal labs: ABO, Rh:  Opos Antibody:  neg Rubella:  imm RPR:   nr HBsAg:   neg HIV:   nr GBS:   unk   CXR EXAM: PORTABLE CHEST 1 VIEW  COMPARISON:  None.  FINDINGS: Mild right basilar opacity. Normal pleural spaces and cardiomediastinal contours.  IMPRESSION: Mild right basilar opacity may indicate  atelectasis or infection.   Electronically Signed   By: Deatra Robinson M.D.  Assessment/Plan: This is a 34yo [id number] @ 29 1/7 by 6 3/7 scan NOT c/w LMP presenting to ED with known positive COVID status and worsening productive cough with SOB. Persistent tachycardia/tachypnea noted by ED attending, no evidence of hypoxia, possible right basilar opacity on CXR. Decision made to admit and consult Medicine service for therapy. Contact precautions, plan to admit to [facility] specialty care. NST q shift  Valerie Roys Deleon Passe 07/21/2020, 4:08 AM

## 2020-07-21 NOTE — ED Notes (Signed)
Breakfast ordered 

## 2020-07-21 NOTE — ED Provider Notes (Signed)
[facility] EMERGENCY DEPARTMENT Provider Note   CSN: [id number] Arrival date & time: 07/21/20  0050     History Chief Complaint  Patient presents with  . Shortness of Breath    Alexandra STEGMAN is a 34 y.o. female.  Patient is a 34 year old female who is 7 and half months pregnant with an EDC of December 29.  She has been having coughing and respiratory viral symptoms for 7 days.  She tested positive for Covid 4 days ago.  This was done [facility] a [facility].  She said she has had some progressive shortness of breath.  She has had some nausea and vomiting but has been able to keep down fluids.  She is also had some loose stools.  She is feeling the baby move around.  She denies any vaginal bleeding or discharge.  She has had some coughing and congestion.  She is coughing up thick sputum.  She denies any chest pain.  She presents today because she has had worsening shortness of breath.  She is short of breath both at rest and on exertion.  Her OB/GYN is with [facility].        Past Medical History:  Diagnosis Date  . Abnormal Pap smear    lsil  . COVID-19   . Ectopic pregnancy   . Preterm labor     Patient Active Problem List   Diagnosis Date Noted  . COVID-19 affecting pregnancy in third trimester 07/21/2020  . Encounter for insertion of mirena IUD 08/31/2012  . 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 Depression 04/20/2012  . History of ectopic pregnancy 01/12/2012  . History of preterm delivery, currently pregnant 01/12/2012  . History of abnormal Pap smear 01/12/2012  . Supervision of other normal pregnancy 01/12/2012  . Ectopic pregnancy 08/20/2011    Past Surgical History:  Procedure Laterality Date  . DILATION AND CURETTAGE OF UTERUS    . LAPAROSCOPY  08/21/2011   Procedure: LAPAROSCOPY OPERATIVE;  Surgeon: Scheryl Darter, MD;  Location: [facility];  Service: Gynecology;  Laterality: N/A;  Operative Laparoscopy with Left Partial  Salpingectomy     OB History    Gravida  8   Para  3   Term  2   Preterm  1   AB  4   Living  3     SAB      TAB  3   Ectopic  1   Multiple      Live Births  3           Family History  Problem Relation Age of Onset  . Cancer Paternal Uncle   . Diabetes Maternal Grandmother   . Anesthesia problems Neg Hx   . Hypotension Neg Hx   . Malignant hyperthermia Neg Hx   . Pseudochol deficiency Neg Hx     Social History   Tobacco Use  . Smoking status: Never Smoker  . Smokeless tobacco: Never Used  Substance Use Topics  . Alcohol use: No  . Drug use: No    Home Medications Prior to Admission medications   Medication Sig Start Date End Date Taking? Authorizing Provider  acetaminophen (TYLENOL) 500 MG tablet Take 500-1,000 mg by mouth every 8 (eight) hours as needed for moderate pain or fever.   Yes [provider]  ascorbic acid (VITAMIN C) 500 MG tablet Take 1,000 mg by mouth daily.   Yes [provider]  pantoprazole (PROTONIX) 40 MG tablet Take 40 mg  by mouth daily. 02/22/20  Yes [provider]  Prenatal Vit-Fe Fumarate-FA (PRENATAL MULTIVITAMIN) TABS tablet Take 1 tablet by mouth daily.   Yes [provider]    Allergies    Dog epithelium  Review of Systems   Review of Systems  Constitutional: Positive for fatigue and fever. Negative for chills and diaphoresis.  HENT: Positive for congestion and rhinorrhea. Negative for sneezing.   Eyes: Negative.   Respiratory: Positive for cough and shortness of breath. Negative for chest tightness.   Cardiovascular: Negative for chest pain and leg swelling.  Gastrointestinal: Positive for diarrhea, nausea and vomiting. Negative for abdominal pain and blood in stool.  Genitourinary: Negative for difficulty urinating, flank pain, frequency and hematuria.  Musculoskeletal: Positive for myalgias. Negative for arthralgias and back pain.  Skin: Negative for rash.  Neurological:  Positive for light-headedness and headaches. Negative for dizziness, speech difficulty, weakness and numbness.    Physical Exam Updated Vital Signs BP 109/79 (BP Location: Right Arm)   Pulse (!) 113   Temp 98.5 F (36.9 C) (Oral)   Resp 19   SpO2 95%   Physical Exam Constitutional:      Appearance: She is well-developed.  HENT:     Head: Normocephalic and atraumatic.  Eyes:     Pupils: Pupils are equal, round, and reactive to light.  Cardiovascular:     Rate and Rhythm: Regular rhythm. Tachycardia present.     Heart sounds: Normal heart sounds.  Pulmonary:     Effort: Pulmonary effort is normal. Tachypnea present. No respiratory distress.     Breath sounds: Normal breath sounds. No wheezing or rales.  Chest:     Chest wall: No tenderness.  Abdominal:     General: Bowel sounds are normal.     Palpations: Abdomen is soft.     Tenderness: There is no abdominal tenderness. There is no guarding or rebound.  Musculoskeletal:        General: Normal range of motion.     Cervical back: Normal range of motion and neck supple.  Lymphadenopathy:     Cervical: No cervical adenopathy.  Skin:    General: Skin is warm and dry.     Findings: No rash.  Neurological:     Mental Status: She is alert and oriented to person, place, and time.     ED Results / Procedures / Treatments   Labs (all labs ordered are listed, but only abnormal results are displayed) Labs Reviewed  RESPIRATORY PANEL BY RT PCR (FLU A&B, COVID) - Abnormal; Notable for the following components:      Result Value   SARS Coronavirus 2 by RT PCR POSITIVE (*)    All other components within normal limits  BASIC METABOLIC PANEL - Abnormal; Notable for the following components:   Sodium 131 (*)    CO2 19 (*)    Glucose, Bld 102 (*)    BUN 5 (*)    Calcium 8.4 (*)    All other components within normal limits  D-DIMER, QUANTITATIVE (NOT AT [facility]) - Abnormal; Notable for the following components:   D-Dimer, Quant 3.00  (*)    All other components within normal limits  FIBRINOGEN - Abnormal; Notable for the following components:   Fibrinogen 558 (*)    All other components within normal limits  C-REACTIVE PROTEIN - Abnormal; Notable for the following components:   CRP 2.8 (*)    All other components within normal limits  I-STAT BETA HCG BLOOD, ED ([facility],  [facility], [facility] ONLY) - Abnormal; Notable for the following components:   I-stat hCG, quantitative >2,000.0 (*)    All other components within normal limits  CULTURE, BLOOD (ROUTINE X 2)  CULTURE, BLOOD (ROUTINE X 2)  CBC  LACTIC ACID, PLASMA  PROCALCITONIN  LACTATE DEHYDROGENASE  FERRITIN  TRIGLYCERIDES  TYPE AND SCREEN  TROPONIN I (HIGH SENSITIVITY)  TROPONIN I (HIGH SENSITIVITY)    EKG None  Radiology DG Chest Port 1 View  Result Date: 07/21/2020 CLINICAL DATA:  COVID-19 EXAM: PORTABLE CHEST 1 VIEW COMPARISON:  None. FINDINGS: Mild right basilar opacity. Normal pleural spaces and cardiomediastinal contours. IMPRESSION: Mild right basilar opacity may indicate atelectasis or infection. Electronically Signed   By: Deatra Robinson M.D.   On: 07/21/2020 03:10    Procedures Procedures (including critical care time)  Medications Ordered in ED Medications  pantoprazole (PROTONIX) EC tablet 40 mg (has no administration in time range)  acetaminophen (TYLENOL) tablet 650 mg (650 mg Oral Given 07/21/20 0639)  zolpidem (AMBIEN) tablet 5 mg (has no administration in time range)  docusate sodium (COLACE) capsule 100 mg (has no administration in time range)  calcium carbonate (TUMS - dosed in mg elemental calcium) chewable tablet 400 mg of elemental calcium (has no administration in time range)  prenatal multivitamin tablet 1 tablet (has no administration in time range)  remdesivir 100 mg in sodium chloride 0.9 % 100 mL IVPB (has no administration in time range)    Followed by  remdesivir 100 mg in sodium chloride 0.9 % 100 mL IVPB (has no administration in time  range)  sodium chloride 0.9 % bolus 1,000 mL (1,000 mLs Intravenous New Bag/Given 07/21/20 4098)    ED Course  I have reviewed the triage vital signs and the nursing notes.  Pertinent labs & imaging results that were available during my care of the patient were reviewed by me and considered in my medical decision making (see chart for details).    MDM Rules/Calculators/A&P                          Patient is a 34 year old female who presents with worsening shortness of breath in the setting of Covid.  She does not have hypoxia but she is persistently tachycardic and tachypneic.  X-ray shows some opacity in her right lower lung. Her blood pressure stable.  Given her tachycardia and tachypnea, I feel like she will need admission for ongoing treatment.  She does not have any chest pain, unilateral leg swelling or other symptoms that would be more concerning for PE.  I spoke with the OB/GYN on-call who will admit the patient for further treatment.  I also spoke with the hospitalist who will do a medicine consult for Covid management. Final Clinical Impression(s) / ED Diagnoses Final diagnoses:  COVID-19 virus infection    Rx / DC Orders ED Discharge Orders    None       Rolan Bucco, MD 07/21/20 0725

## 2020-07-21 NOTE — Consult Note (Signed)
Medicine Consultation Note  [name]:[id number] DOB: 01-30-1986 DOA: 07/21/2020  PCP: [facility]  Patient coming from: home  I have personally briefly reviewed [facility].  Chief Concern: shortness of breath   HPI: Alexandra Davidson is a 34 y.o. female with medical history significant for third trimester pregnancy presents with chief concern of cough and worsening shortness of breath and chest pain.  She reports that she started coughing on Monday, 07/15/2020.  She reports the cough is nonproductive except for a couple times today where it was thick and green/yellow.  She denies fever and chills and nausea.  She endorses 2 episodes of vomiting on day of presentation and states the vomitus is negative for blood.  On further questioning regarding chest pain, she states that the pain is worse when she coughs and with palpation.  It started approximately Thursday, 07/18/2020.  She endorses that sometimes the pain is felt during inhalation as well.  She reports that she suspects she got infected with COVID-19 from her son.  She reports that her sons school there was a boy that tested positive for Covid.  She reports that she has not been vaccinated for COVID-19 and that her son is not old enough to be vaccinated.  At bedside Ms. Crosland does not appear to be in acute distress and able to converse in full sentences without use of accessory respiratory muscles.  Social history: She lives at home, denies any tobacco use, previously before pregnancy she infrequently drinks and is currently not drinking due to pregnancy.  Denies history or current recreational drug use.  ED Course: Discussed with ED provider patient tested positive for COVID-19 approximately 2 days ago and now is complaining of shortness of breath.  ED provider will contact OB/GYN service for admission as patient is in third trimester of  pregnancy.  Review of Systems: As per HPI otherwise 10 point review of systems negative.  Past Medical History:  Diagnosis Date  . Abnormal Pap smear    lsil  . COVID-19   . Ectopic pregnancy   . Preterm labor    Past Surgical History:  Procedure Laterality Date  . DILATION AND CURETTAGE OF UTERUS    . LAPAROSCOPY  08/21/2011   Procedure: LAPAROSCOPY OPERATIVE;  Surgeon: Scheryl Darter, MD;  Location: [facility];  Service: Gynecology;  Laterality: N/A;  Operative Laparoscopy with Left Partial Salpingectomy   Social History:  reports that she has never smoked. She has never used smokeless tobacco. She reports that she does not drink alcohol and does not use drugs.  No Known Allergies  Family History  Problem Relation Age of Onset  . Cancer Paternal Uncle   . Diabetes Maternal Grandmother   . Anesthesia problems Neg Hx   . Hypotension Neg Hx   . Malignant hyperthermia Neg Hx   . Pseudochol deficiency Neg Hx    Family history: Family history reviewed and patient's son tested positive for covid.   Prior to Admission medications   Medication Sig Start Date End Date Taking? Authorizing Provider  acetaminophen (TYLENOL) 500 MG tablet Take 500-1,000 mg by mouth every 8 (eight) hours as needed for moderate pain or fever.   Yes [provider]  ascorbic acid (VITAMIN C) 500 MG tablet Take 1,000 mg by mouth daily.   Yes [provider]  pantoprazole (PROTONIX) 40 MG tablet Take 40 mg by mouth daily. 02/22/20  Yes [provider]  Prenatal  Vit-Fe Fumarate-FA (PRENATAL MULTIVITAMIN) TABS tablet Take 1 tablet by mouth daily.   Yes [provider]   Physical Exam: Vitals:   07/21/20 0104 07/21/20 0200  BP: (!) 134/99 (!) 148/80  Pulse: (!) 132 (!) 118  Resp: (!) 25 (!) 24  Temp: 97.9 F (36.6 C)   TempSrc: Oral   SpO2: 99% 96%   Constitutional: NAD, calm, comfortable Eyes: PERRL, lids and conjunctivae normal ENMT: Mucous membranes are moist.  Posterior pharynx clear of any exudate or lesions.Normal dentition.  Neck: normal, supple, no masses, no thyromegaly Respiratory: clear to auscultation bilaterally, no wheezing, no crackles. Normal respiratory effort. No accessory muscle use.  Cardiovascular: Regular rate and rhythm, no murmurs / rubs / gallops. No extremity edema. 2+ pedal pulses. No carotid bruits.  Abdomen: pregnant abdomen, no tenderness, no masses palpated. Bowel sounds positive.  Musculoskeletal: no clubbing / cyanosis. No joint deformity upper and lower extremities. Good ROM, no contractures. Normal muscle tone.  Skin: multiple tattoos that is negative for physical exam evidence of infection, no rashes, lesions, ulcers. No induration Neurologic: CN 2-12 grossly intact. Sensation intact. Strength 5/5 in all 4.  Psychiatric: Normal judgment and insight. Alert and oriented x 3. Normal mood.   Labs on Admission: I have personally reviewed following labs and imaging studies  CBC: Recent Labs  Lab 07/21/20 0111  WBC 7.0  HGB 12.4  HCT 37.1  MCV 93.9  PLT 163   Basic Metabolic Panel: Recent Labs  Lab 07/21/20 0111  NA 131*  K 3.7  CL 102  CO2 19*  GLUCOSE 102*  BUN 5*  CREATININE 0.62  CALCIUM 8.4*   Urine analysis:    Component Value Date/Time   COLORURINE AMBER (A) 12/14/2011 0823   APPEARANCEUR CLEAR 12/14/2011 0823   LABSPEC 1.020 07/13/2012 0949   PHURINE 7.0 07/13/2012 0949   GLUCOSEU NEGATIVE 07/13/2012 0949   HGBUR NEGATIVE 07/13/2012 0949   BILIRUBINUR NEGATIVE 07/13/2012 0949   KETONESUR NEGATIVE 07/13/2012 0949   PROTEINUR NEGATIVE 07/13/2012 0949   UROBILINOGEN 1.0 07/13/2012 0949   NITRITE NEGATIVE 07/13/2012 0949   LEUKOCYTESUR LARGE (A) 07/13/2012 0949   Radiological Exams on Admission: Personally reviewed and I agree with radiologist reading as below.  DG Chest Port 1 View  Result Date: 07/21/2020 CLINICAL DATA:  COVID-19 EXAM: PORTABLE CHEST 1 VIEW COMPARISON:  None. FINDINGS:  Mild right basilar opacity. Normal pleural spaces and cardiomediastinal contours. IMPRESSION: Mild right basilar opacity may indicate atelectasis or infection. Electronically Signed   By: Deatra Robinson M.D.   On: 07/21/2020 03:10   Supportive care EKG: Independently reviewed, showing sinus tachycardia with rate of 134, no ST-T wave changes  Assessment/Plan  Active Problems:   COVID-19 affecting pregnancy in third trimester   # Covid-19 affecting pregnancy during 3rd trimester -currently mild - Patient is currently on room air and does not appear to be using accessory muscles during respiration - I recommend primary provider to initiate remdesivir IV to complete 5-day course - Recommend to check CMP daily while patient is on remdesivir to monitor renal and liver function - Per guidelines, currently I do not recommend dexamethasone as dexamethasone is recommended in patients with COVID-19 who are requiring supplemental oxygenation or ventilatory support; Dexamethasone 6 mg daily can be initiated if patient is requiring supplemental oxygenation and or ventilatory support - Per guidelines, baricitinib is not indicated at this time and is reserved for patients with moderate to severe COVID requiring high-flow or noninvasive ventilation - Recommend primary  provider to consult pharmacy for remdesivir initiation and dosing - Currently, I have low clinical suspicion for superimposed bacterial pneumonia  - Acetaminophen 325 mg PO q6h prn for antipyretic control  - Continue supportive care with incentive spirometry every 2 hours during waking hours, oxygen supplementation as needed and appropriate p.o. intake  # Chest pain - right upper sternal border, reproducible - likely musculoskeletal from coughing - Ice/warm heating pack to the area as needed  Thank you for the consult, Hospitalist service will continue to follow.  Code Status: full  Diet: regular Disposition Plan: per primary  Davette Nugent N Shondrea Steinert  D.O. [facility]  If 7AM-7PM, please contact day-coverage provider [email or web address]  07/21/2020, 4:08 AM

## 2020-07-21 NOTE — Progress Notes (Signed)
Chart review  Patient has had no further obstetric complaints since admission. Currently on NST, appears reactive without activity on TOCO.   BP 104/69 (BP Location: Right Arm)   Pulse (!) 108   Temp 98.2 F (36.8 C) (Oral)   Resp 18   SpO2 100%   Initiated remdesivir today per Medicine and will complete 5d course; daily LFTs to trend. On Lovenox 40 SQ QD, BL LE Dopplers neg to DVT. Has remained 97-100% on room air, still tachycardic in 110s, continue telemetry. Afebrile.   Will continue to follow with Medicine, appreciate COVID recs

## 2020-07-21 NOTE — Progress Notes (Signed)
Patient transported from 5W-23 to 1S-12 without any issues. All belongings sent with pt.

## 2020-07-21 NOTE — Progress Notes (Signed)
Alexandra Davidson [id number] Admitted to : 07/21/2020 6:07 AM Attending Provider: Carlisle Cater, MD    Alexandra Davidson is a 34 y.o. female patient admitted from ED awake, alert  & orientated  X 3,  Full Code, VSS - Blood pressure 109/79, pulse (!) 113, temperature 98.5 F (36.9 C), temperature source Oral, resp. rate 19, SpO2 95 %.,RA, shortness of breath with exertion, no c/o chest pain, complains of abdominal discomfort. Tele placed and pt is currently running: ST   IV site WDL:   with a transparent dsg that's clean dry and intact.  Allergies:   Allergies  Allergen Reactions  . Dog Epithelium      Past Medical History:  Diagnosis Date  . Abnormal Pap smear    lsil  . COVID-19   . Ectopic pregnancy   . Preterm labor     History:  obtained from patient  Pt orientation to unit, room and routine.  Admission INP armband ID verified with patient, and in place. SR up x 2, fall risk assessment complete with Patient verbalizing understanding of risks associated with falls. Pt verbalizes an understanding of how to use the call bell and to call for help before getting out of bed.  Skin, clean-dry- intact without evidence of bruising, or skin tears.   No evidence of skin break down noted on exam.   Will cont to monitor and assist as needed.  Elisha Ponder, RN 07/21/2020 6:07 AM

## 2020-07-21 NOTE — Progress Notes (Signed)
Bilateral Lower Ext. study completed.   See CVProc for preliminary results.   Toniqua Melamed, RDMS, RVT 

## 2020-07-22 DIAGNOSIS — U071 COVID-19: Secondary | ICD-10-CM | POA: Diagnosis not present

## 2020-07-22 DIAGNOSIS — O98513 Other viral diseases complicating pregnancy, third trimester: Secondary | ICD-10-CM | POA: Diagnosis not present

## 2020-07-22 LAB — CBC
HCT: 35.6 % — ABNORMAL LOW (ref 36.0–46.0)
Hemoglobin: 11.6 g/dL — ABNORMAL LOW (ref 12.0–15.0)
MCH: 31.2 pg (ref 26.0–34.0)
MCHC: 32.6 g/dL (ref 30.0–36.0)
MCV: 95.7 fL (ref 80.0–100.0)
Platelets: 136 10*3/uL — ABNORMAL LOW (ref 150–400)
RBC: 3.72 MIL/uL — ABNORMAL LOW (ref 3.87–5.11)
RDW: 12.3 % (ref 11.5–15.5)
WBC: 5.9 10*3/uL (ref 4.0–10.5)
nRBC: 0 % (ref 0.0–0.2)

## 2020-07-22 LAB — COMPREHENSIVE METABOLIC PANEL
ALT: 24 U/L (ref 0–44)
AST: 30 U/L (ref 15–41)
Albumin: 2.4 g/dL — ABNORMAL LOW (ref 3.5–5.0)
Alkaline Phosphatase: 77 U/L (ref 38–126)
Anion gap: 9 (ref 5–15)
BUN: 5 mg/dL — ABNORMAL LOW (ref 6–20)
CO2: 19 mmol/L — ABNORMAL LOW (ref 22–32)
Calcium: 8.2 mg/dL — ABNORMAL LOW (ref 8.9–10.3)
Chloride: 104 mmol/L (ref 98–111)
Creatinine, Ser: 0.8 mg/dL (ref 0.44–1.00)
GFR, Estimated: >60 mL/min (ref >60)
Glucose, Bld: 84 mg/dL (ref 70–99)
Potassium: 3.7 mmol/L (ref 3.5–5.1)
Sodium: 132 mmol/L — ABNORMAL LOW (ref 135–145)
Total Bilirubin: 1.5 mg/dL — ABNORMAL HIGH (ref 0.3–1.2)
Total Protein: 5.8 g/dL — ABNORMAL LOW (ref 6.5–8.1)

## 2020-07-22 LAB — MAGNESIUM: Magnesium: 1.6 mg/dL — ABNORMAL LOW (ref 1.7–2.4)

## 2020-07-22 LAB — C-REACTIVE PROTEIN: CRP: 4.9 mg/dL — ABNORMAL HIGH (ref <1.0)

## 2020-07-22 LAB — D-DIMER, QUANTITATIVE: D-Dimer, Quant: 4.5 ug/mL-FEU — ABNORMAL HIGH (ref 0.00–0.50)

## 2020-07-22 MED ORDER — ENOXAPARIN SODIUM 60 MG/0.6ML ~~LOC~~ SOLN
60.0000 mg | SUBCUTANEOUS | Status: DC
  Administered 2020-07-23: 60 mg via SUBCUTANEOUS
  Filled 2020-07-22: qty 0.6

## 2020-07-22 MED ORDER — ENOXAPARIN SODIUM 300 MG/3ML IJ SOLN
20.0000 mg | Freq: Once | INTRAMUSCULAR | Status: AC
  Administered 2020-07-22: 20 mg via SUBCUTANEOUS
  Filled 2020-07-22 (×2): qty 0.2

## 2020-07-22 NOTE — Progress Notes (Addendum)
PROGRESS NOTE                                                                             PROGRESS NOTE                                                                                                                                                                                                             Patient Demographics:    Alexandra Davidson, is a 34 y.o. female, DOB - [birth year], HXT:[id number]  Outpatient Primary MD for the patient is [facility]    LOS - 1  Admit date - 07/21/2020    Chief Complaint  Patient presents with  . Shortness of Breath       Brief Narrative    Alexandra Davidson is a 34 y.o. female with medical history significant for third  presents with chief concern of cough and worsening shortness of breath and chest pain, he described chest pain more as musculoskeletal related to cough, she tested positive for COVID-19, started on IV remdesivir.   Subjective:    Alexandra Davidson today she still reports cough, mainly when using incentive spirometer, she does report some dyspnea as well, reports her chest pain mainly related to cough.  .   Assessment  & Plan :    Active Problems:   COVID-19 affecting pregnancy in third trimester  COVID-19 infection -She is unvaccinated, x-ray significant for right basal opacity, Atelectasis  versus COVID-19 of pneumonia. -Remains with no oxygen requirement, no indication for steroids. -Continue with IV remdesivir, monitor LFTs closely. -Was encouraged to use incentive spirometer, flutter valve, out of bed to chair and to ambulate. -She is not a candidate monoclonal antibody given she is symptomatic and admitted for COVID-19 treatment. -Continue to trend inflammatory markers  Chest pain -Musculoskeletal, reproducible, related to cough.  Elevated D-dimers -This is most likely due to inflammation in the setting of COVID-19 infection, venous Dopplers are  negative for DVT , she is not hypoxic or tachycardic, low clinical suspicion for PE,  no need to expose the patient for CTA chest with such low clinical suspicion, if  D-dimers is trending up to 4.5 today, will continue to monitor closely, if she develops any hypoxia, D-dimers keep trending up, she may need CTA chest to rule out PE, for now we will keep on DVT prophylaxis dose, it will be changed from 40 mg subcu daily to 0.5 mg/kg every 24 hours.  I have discussed this plan with the patient.  Pregnancy in third trimester -Management per primary OB service  SpO2: 99 %  Recent Labs  Lab 07/21/20 0111 07/21/20 0345 07/21/20 0410 07/21/20 0414 07/21/20 1148 07/22/20 0653  WBC 7.0  --   --   --   --  5.9  PLT 163  --   --   --   --  136*  CRP  --  2.8*  --   --   --  4.9*  DDIMER  --  3.00*  --   --   --  4.50*  PROCALCITON  --  0.18  --   --   --   --   AST  --   --   --   --  24 30  ALT  --   --   --   --  23 24  ALKPHOS  --   --   --   --  73 77  BILITOT  --   --   --   --  1.1 1.5*  ALBUMIN  --   --   --   --  2.6* 2.4*  LATICACIDVEN  --   --  0.8  --   --   --   SARSCOV2NAA  --   --   --  POSITIVE*  --   --        ABG  No results found for: PHART, PCO2ART, PO2ART, HCO3, TCO2, ACIDBASEDEF, O2SAT     Code Status :  Full  Consults  :  OB is primary  Procedures  :  none  DVT Prophylaxis  :  Lovenox  -Thanks for this medical consult, will continue to follow, please any question contact us via my own.  Lab Results  Component Value Date   PLT 136 (L) 07/22/2020    Diet :  Diet Order            Diet regular Room service appropriate? Yes; Fluid consistency: Thin  Diet effective now                  Inpatient Medications  Scheduled Meds: . docusate sodium  100 mg Oral Daily  . enoxaparin (LOVENOX) injection  40 mg Subcutaneous Q24H  . pantoprazole  40 mg Oral Daily  . prenatal multivitamin  1 tablet Oral Q1200   Continuous Infusions: . remdesivir 100 mg  in NS 100 mL 100 mg (07/22/20 1034)   PRN Meds:.acetaminophen, calcium carbonate, guaiFENesin-dextromethorphan, zolpidem  Antibiotics  :    Anti-infectives (From admission, onward)   Start     Dose/Rate Route Frequency Ordered Stop   07/22/20 1000  remdesivir 100 mg in sodium chloride 0.9 % 100 mL IVPB       "Followed by" Linked Group Details   100 mg 200 mL/hr over 30 Minutes Intravenous Daily 07/21/20 0643 07/26/20 0959   07/21/20 1500  remdesivir 100 mg in sodium chloride 0.9 % 100 mL IVPB        100 mg 200 mL/hr over 30 Minutes Intravenous Every 30  min 07/21/20 1343 07/21/20 1635   07/21/20 0730  remdesivir 100 mg in sodium chloride 0.9 % 100 mL IVPB       "Followed by" Linked Group Details   100 mg 200 mL/hr over 30 Minutes Intravenous Every 30 min 07/21/20 0643 07/21/20 0829          Mliss Fritz Isamu Trammel M.D on 07/22/2020 at 11:10 AM  To page go to [email or web address]  pager [phone]  [facility] -  Office  [phone]    Objective:   Vitals:   07/22/20 1010 07/22/20 1015 07/22/20 1020 07/22/20 1109  BP:    120/77  Pulse:    (!) 119  Resp:    20  Temp:      TempSrc:      SpO2: 99% 99% 99% 99%  Weight:        Wt Readings from Last 3 Encounters:  07/22/20 119.7 kg  09/30/12 99.6 kg  08/31/12 98.2 kg     Intake/Output Summary (Last 24 hours) at 07/22/2020 1110 Last data filed at 07/22/2020 1108 Gross per 24 hour  Intake --  Output 1200 ml  Net -1200 ml     Physical Exam  Awake Alert, Oriented X 3, No new F.N deficits, Normal affect Symmetrical Chest wall movement, Good air movement bilaterally, CTAB RRR,No Gallops,Rubs or new Murmurs, No Parasternal Heave +ve B.Sounds, Abd Soft, No Cyanosis, Clubbing or edema, No new Rash or bruise       Data Review:    CBC Recent Labs  Lab 07/21/20 0111 07/22/20 0653  WBC 7.0 5.9  HGB 12.4 11.6*  HCT 37.1 35.6*  PLT 163 136*  MCV 93.9 95.7  MCH 31.4 31.2  MCHC 33.4 32.6  RDW 12.0 12.3     Recent Labs  Lab 07/21/20 0111 07/21/20 0345 07/21/20 0410 07/21/20 1148 07/22/20 0653  NA 131*  --   --  134* 132*  K 3.7  --   --  3.6 3.7  CL 102  --   --  104 104  CO2 19*  --   --  19* 19*  GLUCOSE 102*  --   --  85 84  BUN 5*  --   --  <5* 5*  CREATININE 0.62  --   --  0.62 0.80  CALCIUM 8.4*  --   --  8.2* 8.2*  AST  --   --   --  24 30  ALT  --   --   --  23 24  ALKPHOS  --   --   --  73 77  BILITOT  --   --   --  1.1 1.5*  ALBUMIN  --   --   --  2.6* 2.4*  MG  --   --   --   --  1.6*  CRP  --  2.8*  --   --  4.9*  DDIMER  --  3.00*  --   --  4.50*  PROCALCITON  --  0.18  --   --   --   LATICACIDVEN  --   --  0.8  --   --     ------------------------------------------------------------------------------------------------------------------ Recent Labs    07/21/20 0345  TRIG 135    No results found for: HGBA1C ------------------------------------------------------------------------------------------------------------------ No results for input(s): TSH, T4TOTAL, T3FREE, THYROIDAB in the last 72 hours.  Invalid input(s): FREET3  Cardiac Enzymes No results for input(s): CKMB, TROPONINI, MYOGLOBIN in the last 168 hours.  Invalid input(s): CK ------------------------------------------------------------------------------------------------------------------  No results found for: BNP  Micro Results Recent Results (from the past 240 hour(s))  Blood Culture (routine x 2)     Status: None (Preliminary result)   Collection Time: 07/21/20  4:10 AM   Specimen: BLOOD  Result Value Ref Range Status   Specimen Description BLOOD RIGHT HAND  Final   Special Requests   Final    BOTTLES DRAWN AEROBIC AND ANAEROBIC Blood Culture results may not be optimal due to an inadequate volume of blood received in culture bottles   Culture   Final    NO GROWTH 1 DAY Performed at [facility], [address], [place], [place] [address]    Report Status PENDING  Incomplete   Blood Culture (routine x 2)     Status: None (Preliminary result)   Collection Time: 07/21/20  4:12 AM   Specimen: BLOOD  Result Value Ref Range Status   Specimen Description BLOOD LEFT ARM  Final   Special Requests   Final    BOTTLES DRAWN AEROBIC AND ANAEROBIC Blood Culture adequate volume   Culture   Final    NO GROWTH 1 DAY Performed at [facility], [address] [place], [place] [address]    Report Status PENDING  Incomplete  Respiratory Panel by RT PCR (Flu A&B, Covid) - Nasopharyngeal Swab     Status: Abnormal   Collection Time: 07/21/20  4:14 AM   Specimen: Nasopharyngeal Swab  Result Value Ref Range Status   SARS Coronavirus 2 by RT PCR POSITIVE (A) NEGATIVE Final    Comment: RESULT CALLED TO, READ BACK BY AND VERIFIED WITH: Mardene Celeste [id number] 07/21/2020 T. TYSOR (NOTE) SARS-CoV-2 target nucleic acids are DETECTED.  SARS-CoV-2 RNA is generally detectable in upper respiratory specimens  during the acute phase of infection. Positive results are indicative of the presence of the identified virus, but do not rule out bacterial infection or co-infection with other pathogens not detected by the test. Clinical correlation with patient history and other diagnostic information is necessary to determine patient infection status. The expected result is Negative.  Fact Sheet for Patients:  https://www.moore.com/  Fact Sheet for Healthcare Providers: https://www.young.biz/  This test is not yet approved or cleared by the [place] FDA and  has been authorized for detection and/or diagnosis of SARS-CoV-2 by FDA under an Emergency Use Authorization (EUA).  This EUA will remain in effect (meaning this test c an be used) for the duration of  the COVID-19 declaration under Section 564(b)(1) of the Act, 21 U.S.C. section 360bbb-3(b)(1), unless the authorization is terminated or revoked sooner.      Influenza A by PCR NEGATIVE  NEGATIVE Final   Influenza B by PCR NEGATIVE NEGATIVE Final    Comment: (NOTE) The Xpert Xpress SARS-CoV-2/FLU/RSV assay is intended as an aid in  the diagnosis of influenza from Nasopharyngeal swab specimens and  should not be used as a sole basis for treatment. Nasal washings and  aspirates are unacceptable for Xpert Xpress SARS-CoV-2/FLU/RSV  testing.  Fact Sheet for Patients: https://www.moore.com/  Fact Sheet for Healthcare Providers: https://www.young.biz/  This test is not yet approved or cleared by the [place] FDA and  has been authorized for detection and/or diagnosis of SARS-CoV-2 by  FDA under an Emergency Use Authorization (EUA). This EUA will remain  in effect (meaning this test can be used) for the duration of the  Covid-19 declaration under Section 564(b)(1) of the Act, 21  U.S.C. section 360bbb-3(b)(1), unless the authorization  is  terminated or revoked. Performed at [facility], [address] [place], [place] [address]     Radiology Reports DG Chest [place] 1 View  Result Date: 07/21/2020 CLINICAL DATA:  COVID-19 EXAM: PORTABLE CHEST 1 VIEW COMPARISON:  None. FINDINGS: Mild right basilar opacity. Normal pleural spaces and cardiomediastinal contours. IMPRESSION: Mild right basilar opacity may indicate atelectasis or infection. Electronically Signed   By: Deatra Robinson M.D.   On: 07/21/2020 03:10   VAS [place] LOWER EXTREMITY VENOUS (DVT)  Result Date: 07/21/2020  Lower Venous DVTStudy Indications: Pain, and Swelling.  Risk Factors: Patient pregnant. Performing Technologist: Jannet Askew RCT RDMS  Examination Guidelines: A complete evaluation includes B-mode imaging, spectral Doppler, color Doppler, and power Doppler as needed of all accessible portions of each vessel. Bilateral testing is considered an integral part of a complete examination. Limited examinations for reoccurring indications may be performed as noted.  The reflux portion of the exam is performed with the patient in reverse Trendelenburg.  +---------+---------------+---------+-----------+----------+--------------+ RIGHT    CompressibilityPhasicitySpontaneityPropertiesThrombus Aging +---------+---------------+---------+-----------+----------+--------------+ CFV      Full           Yes      Yes                                 +---------+---------------+---------+-----------+----------+--------------+ SFJ      Full                                                        +---------+---------------+---------+-----------+----------+--------------+ FV Prox  Full                                                        +---------+---------------+---------+-----------+----------+--------------+ FV Mid   Full                                                        +---------+---------------+---------+-----------+----------+--------------+ FV DistalFull                                                        +---------+---------------+---------+-----------+----------+--------------+ PFV      Full                                                        +---------+---------------+---------+-----------+----------+--------------+ POP      Full           Yes      Yes                                 +---------+---------------+---------+-----------+----------+--------------+  PTV      Full                                                        +---------+---------------+---------+-----------+----------+--------------+ PERO     Full                                                        +---------+---------------+---------+-----------+----------+--------------+   +---------+---------------+---------+-----------+----------+--------------+ LEFT     CompressibilityPhasicitySpontaneityPropertiesThrombus Aging +---------+---------------+---------+-----------+----------+--------------+ CFV      Full           Yes       Yes                                 +---------+---------------+---------+-----------+----------+--------------+ SFJ      Full                                                        +---------+---------------+---------+-----------+----------+--------------+ FV Prox  Full                                                        +---------+---------------+---------+-----------+----------+--------------+ FV Mid   Full                                                        +---------+---------------+---------+-----------+----------+--------------+ FV DistalFull                                                        +---------+---------------+---------+-----------+----------+--------------+ PFV      Full                                                        +---------+---------------+---------+-----------+----------+--------------+ POP      Full           Yes      Yes                                 +---------+---------------+---------+-----------+----------+--------------+ PTV      Full                                                        +---------+---------------+---------+-----------+----------+--------------+  PERO     Full                                                        +---------+---------------+---------+-----------+----------+--------------+     Summary: RIGHT: - There is no evidence of deep vein thrombosis in the lower extremity.  - No cystic structure found in the popliteal fossa.  LEFT: - There is no evidence of deep vein thrombosis in the lower extremity.  - No cystic structure found in the popliteal fossa.  *See table(s) above for measurements and observations. Electronically signed by Coral Else MD on 07/21/2020 at 3:12:55 PM.    Final

## 2020-07-22 NOTE — Progress Notes (Signed)
HD #2, [redacted]W[redacted]D, COVID +fetal movement, occ ctx FHT-reactive with occ ctx  Doing well so far from [facility] standpoint, leaving care to medicine team for COVID

## 2020-07-23 ENCOUNTER — Inpatient Hospital Stay (HOSPITAL_COMMUNITY): Payer: Medicaid Other

## 2020-07-23 DIAGNOSIS — O98513 Other viral diseases complicating pregnancy, third trimester: Secondary | ICD-10-CM | POA: Diagnosis not present

## 2020-07-23 DIAGNOSIS — R7989 Other specified abnormal findings of blood chemistry: Secondary | ICD-10-CM

## 2020-07-23 DIAGNOSIS — U071 COVID-19: Secondary | ICD-10-CM | POA: Diagnosis not present

## 2020-07-23 LAB — CBC
HCT: 35 % — ABNORMAL LOW (ref 36.0–46.0)
Hemoglobin: 11.6 g/dL — ABNORMAL LOW (ref 12.0–15.0)
MCH: 31 pg (ref 26.0–34.0)
MCHC: 33.1 g/dL (ref 30.0–36.0)
MCV: 93.6 fL (ref 80.0–100.0)
Platelets: 139 10*3/uL — ABNORMAL LOW (ref 150–400)
RBC: 3.74 MIL/uL — ABNORMAL LOW (ref 3.87–5.11)
RDW: 12.3 % (ref 11.5–15.5)
WBC: 5.6 10*3/uL (ref 4.0–10.5)
nRBC: 0 % (ref 0.0–0.2)

## 2020-07-23 LAB — COMPREHENSIVE METABOLIC PANEL
ALT: 23 U/L (ref 0–44)
AST: 29 U/L (ref 15–41)
Albumin: 2.4 g/dL — ABNORMAL LOW (ref 3.5–5.0)
Alkaline Phosphatase: 84 U/L (ref 38–126)
Anion gap: 8 (ref 5–15)
BUN: <5 mg/dL — ABNORMAL LOW (ref 6–20)
CO2: 19 mmol/L — ABNORMAL LOW (ref 22–32)
Calcium: 8.2 mg/dL — ABNORMAL LOW (ref 8.9–10.3)
Chloride: 107 mmol/L (ref 98–111)
Creatinine, Ser: 0.65 mg/dL (ref 0.44–1.00)
GFR, Estimated: >60 mL/min (ref >60)
Glucose, Bld: 92 mg/dL (ref 70–99)
Potassium: 3.4 mmol/L — ABNORMAL LOW (ref 3.5–5.1)
Sodium: 134 mmol/L — ABNORMAL LOW (ref 135–145)
Total Bilirubin: 1.5 mg/dL — ABNORMAL HIGH (ref 0.3–1.2)
Total Protein: 5.7 g/dL — ABNORMAL LOW (ref 6.5–8.1)

## 2020-07-23 LAB — D-DIMER, QUANTITATIVE: D-Dimer, Quant: 9.69 ug/mL-FEU — ABNORMAL HIGH (ref 0.00–0.50)

## 2020-07-23 LAB — MAGNESIUM: Magnesium: 1.6 mg/dL — ABNORMAL LOW (ref 1.7–2.4)

## 2020-07-23 LAB — C-REACTIVE PROTEIN: CRP: 4.6 mg/dL — ABNORMAL HIGH (ref <1.0)

## 2020-07-23 MED ORDER — POTASSIUM CHLORIDE CRYS ER 20 MEQ PO TBCR
40.0000 meq | EXTENDED_RELEASE_TABLET | Freq: Once | ORAL | Status: AC
  Administered 2020-07-23: 40 meq via ORAL
  Filled 2020-07-23: qty 2

## 2020-07-23 MED ORDER — ENOXAPARIN SODIUM 60 MG/0.6ML ~~LOC~~ SOLN
60.0000 mg | Freq: Two times a day (BID) | SUBCUTANEOUS | Status: DC
  Administered 2020-07-23 – 2020-07-25 (×4): 60 mg via SUBCUTANEOUS
  Filled 2020-07-23 (×4): qty 0.6

## 2020-07-23 MED ORDER — MAGNESIUM SULFATE 2 GM/50ML IV SOLN
2.0000 g | Freq: Once | INTRAVENOUS | Status: AC
  Administered 2020-07-23: 2 g via INTRAVENOUS
  Filled 2020-07-23 (×2): qty 50

## 2020-07-23 MED ORDER — IOHEXOL 350 MG/ML SOLN
75.0000 mL | Freq: Once | INTRAVENOUS | Status: AC | PRN
  Administered 2020-07-23: 75 mL via INTRAVENOUS

## 2020-07-23 NOTE — Progress Notes (Signed)
Patient ID: Alexandra Davidson, female   DOB: 1986/06/29, 34 y.o.   MRN: [id number] HD#3, [redacted]W[redacted]D, +COVID  Pt reports still feels unwell but thinks is overall improved from last few days She is appreciating FMs  GEN - appears slightly fatigued, using incentive spirometry, NAD EFM - 150s, reactive on last check SVE - deferred   Hospitalist consult : rising D-Dimers, recommend CT scan  A/P:  Pt to continue with daily NSTs and ok for CT Scan ( per MFM )

## 2020-07-23 NOTE — Progress Notes (Signed)
PROGRESS NOTE                                                                             PROGRESS NOTE                                                                                                                                                                                                             Patient Demographics:    Alexandra Davidson, is a 34 y.o. female, DOB - 04-04-1986, FOY:[id number]  Outpatient Primary MD for the patient is [facility]    LOS - 2  Admit date - 07/21/2020    Chief Complaint  Patient presents with  . Shortness of Breath       Brief Narrative    Alexandra Davidson is a 34 y.o. female with medical history significant for third  presents with chief concern of cough and worsening shortness of breath and chest pain, he described chest pain more as musculoskeletal related to cough, she tested positive for COVID-19, started on IV remdesivir.   Subjective:    Alexandra Davidson today.she is feeling better today, still reports cough .    Assessment  & Plan :    Active Problems:   COVID-19 affecting pregnancy in third trimester  COVID-19 infection -She is unvaccinated, x-ray significant for right basal opacity, Atelectasis  versus COVID-19 of pneumonia. -Remains with no oxygen requirement, no indication for steroids. -Continue with IV remdesivir, monitor LFTs closely. -Was encouraged to use incentive spirometer, flutter valve, out of bed to chair and to ambulate. -She is not a candidate monoclonal antibody given she is symptomatic and admitted for COVID-19 treatment. -Continue to trend inflammatory markers  Chest pain -Musculoskeletal, reproducible, related to cough, given rapidly rising D-dimers, will obtain CTA chest to rule out PE  Elevated D-dimers -Venous Dopplers negative for DVT. -D-dimers keep trending up 3> 4.9> 9.6, have increased her Lovenox prophylaxis dose to 0.5 mg/kg  every 12 hours. -Venous Dopplers negative for DVT. -Have discussed with patient's risks and benefits,  discussed with primary OB service, can proceed with CTA chest PE protocol given the rapidly rising D-dimers despite being on DVT prophylaxis dose.  Pregnancy in third trimester -Management per primary OB service  Hypokalemia/hypomagnesemia -Repleted  SpO2: 99 %  Recent Labs  Lab 07/21/20 0111 07/21/20 0345 07/21/20 0410 07/21/20 0414 07/21/20 1148 07/22/20 0653 07/23/20 0634  WBC 7.0  --   --   --   --  5.9 5.6  PLT 163  --   --   --   --  136* 139*  CRP  --  2.8*  --   --   --  4.9* 4.6*  DDIMER  --  3.00*  --   --   --  4.50* 9.69*  PROCALCITON  --  0.18  --   --   --   --   --   AST  --   --   --   --  ALT  --   --   --   --  ALKPHOS  --   --   --   --  73 77 84  BILITOT  --   --   --   --  1.1 1.5* 1.5*  ALBUMIN  --   --   --   --  2.6* 2.4* 2.4*  LATICACIDVEN  --   --  0.8  --   --   --   --   SARSCOV2NAA  --   --   --  POSITIVE*  --   --   --        ABG  No results found for: PHART, PCO2ART, PO2ART, HCO3, TCO2, ACIDBASEDEF, O2SAT     Code Status :  Full  Consults  :  OB is primary  Procedures  :  none  DVT Prophylaxis  :  Lovenox  -Thanks for this medical consult, will continue to follow, please any question contact us via my own.  Lab Results  Component Value Date   PLT 139 (L) 07/23/2020    Diet :  Diet Order            Diet regular Room service appropriate? Yes; Fluid consistency: Thin  Diet effective now                  Inpatient Medications  Scheduled Meds: . docusate sodium  100 mg Oral Daily  . enoxaparin (LOVENOX) injection  60 mg Subcutaneous Q12H  . pantoprazole  40 mg Oral Daily  . potassium chloride  40 mEq Oral Once  . prenatal multivitamin  1 tablet Oral Q1200   Continuous Infusions: . magnesium sulfate bolus IVPB    . remdesivir 100 mg in NS 100 mL 100 mg (07/23/20 1002)   PRN  Meds:.acetaminophen, calcium carbonate, guaiFENesin-dextromethorphan, zolpidem  Antibiotics  :    Anti-infectives (From admission, onward)   Start     Dose/Rate Route Frequency Ordered Stop   07/22/20 1000  remdesivir 100 mg in sodium chloride 0.9 % 100 mL IVPB       "Followed by" Linked Group Details   100 mg 200 mL/hr over 30 Minutes Intravenous Daily 07/21/20 0643 07/26/20 0959   07/21/20 1500  remdesivir 100 mg in sodium chloride 0.9 % 100 mL IVPB        100 mg 200 mL/hr over 30 Minutes Intravenous Every 30 min 07/21/20 1343 07/21/20 1635   07/21/20 0730  remdesivir 100 mg in sodium chloride  0.9 % 100 mL IVPB       "Followed by" Linked Group Details   100 mg 200 mL/hr over 30 Minutes Intravenous Every 30 min 07/21/20 0643 07/21/20 0829          Mliss Fritz Lamarkus Nebel M.D on 07/23/2020 at 11:47 AM  To page go to [email or web address]  pager [phone]  [facility] -  Office  [phone]    Objective:   Vitals:   07/23/20 0400 07/23/20 0500 07/23/20 0600 07/23/20 0625  BP:    (!) 101/58  Pulse:    (!) 104  Resp:    18  Temp:    98 F (36.7 C)  TempSrc:    Oral  SpO2: 97% 98% 99% 99%  Weight:        Wt Readings from Last 3 Encounters:  07/22/20 119.7 kg  09/30/12 99.6 kg  08/31/12 98.2 kg     Intake/Output Summary (Last 24 hours) at 07/23/2020 1147 Last data filed at 07/23/2020 0932 Gross per 24 hour  Intake 340 ml  Output 2000 ml  Net -1660 ml     Physical Exam  Awake Alert, Oriented X 3, No new F.N deficits, Normal affect Symmetrical Chest wall movement, Good air movement bilaterally, CTAB RRR,No Gallops,Rubs or new Murmurs, No Parasternal Heave +ve B.Sounds,  No Cyanosis, Clubbing or edema, No new Rash or bruise      Data Review:    CBC Recent Labs  Lab 07/21/20 0111 07/22/20 0653 07/23/20 0634  WBC 7.0 5.9 5.6  HGB 12.4 11.6* 11.6*  HCT 37.1 35.6* 35.0*  PLT 163 136* 139*  MCV 93.9 95.7 93.6  MCH 31.4 31.2 31.0  MCHC 33.4 32.6 33.1   RDW 12.0 12.3 12.3    Recent Labs  Lab 07/21/20 0111 07/21/20 0345 07/21/20 0410 07/21/20 1148 07/22/20 0653 07/23/20 0634  NA 131*  --   --  134* 132* 134*  K 3.7  --   --  3.6 3.7 3.4*  CL 102  --   --  104 104 107  CO2 19*  --   --  19* 19* 19*  GLUCOSE 102*  --   --  85 84 92  BUN 5*  --   --  <5* 5* <5*  CREATININE 0.62  --   --  0.62 0.80 0.65  CALCIUM 8.4*  --   --  8.2* 8.2* 8.2*  AST  --   --   --  ALT  --   --   --  ALKPHOS  --   --   --  73 77 84  BILITOT  --   --   --  1.1 1.5* 1.5*  ALBUMIN  --   --   --  2.6* 2.4* 2.4*  MG  --   --   --   --  1.6* 1.6*  CRP  --  2.8*  --   --  4.9* 4.6*  DDIMER  --  3.00*  --   --  4.50* 9.69*  PROCALCITON  --  0.18  --   --   --   --   LATICACIDVEN  --   --  0.8  --   --   --     ------------------------------------------------------------------------------------------------------------------ Recent Labs    07/21/20 0345  TRIG 135    No results found for: HGBA1C ------------------------------------------------------------------------------------------------------------------ No results for input(s): TSH, T4TOTAL, T3FREE, THYROIDAB in the last 72 hours.  Invalid input(s):  FREET3  Cardiac Enzymes No results for input(s): CKMB, TROPONINI, MYOGLOBIN in the last 168 hours.  Invalid input(s): CK ------------------------------------------------------------------------------------------------------------------ No results found for: BNP  Micro Results Recent Results (from the past 240 hour(s))  Blood Culture (routine x 2)     Status: None (Preliminary result)   Collection Time: 07/21/20  4:10 AM   Specimen: BLOOD  Result Value Ref Range Status   Specimen Description BLOOD RIGHT HAND  Final   Special Requests   Final    BOTTLES DRAWN AEROBIC AND ANAEROBIC Blood Culture results may not be optimal due to an inadequate volume of blood received in culture bottles   Culture   Final    NO GROWTH 2  DAYS Performed at [facility], [address] [place], [place] [address]    Report Status PENDING  Incomplete  Blood Culture (routine x 2)     Status: None (Preliminary result)   Collection Time: 07/21/20  4:12 AM   Specimen: BLOOD  Result Value Ref Range Status   Specimen Description BLOOD LEFT ARM  Final   Special Requests   Final    BOTTLES DRAWN AEROBIC AND ANAEROBIC Blood Culture adequate volume   Culture   Final    NO GROWTH 2 DAYS Performed at [facility], [address], [place], [place] [address]    Report Status PENDING  Incomplete  Respiratory Panel by RT PCR (Flu A&B, Covid) - Nasopharyngeal Swab     Status: Abnormal   Collection Time: 07/21/20  4:14 AM   Specimen: Nasopharyngeal Swab  Result Value Ref Range Status   SARS Coronavirus 2 by RT PCR POSITIVE (A) NEGATIVE Final    Comment: RESULT CALLED TO, READ BACK BY AND VERIFIED WITH: Mardene Celeste [id number] 07/21/2020 T. TYSOR (NOTE) SARS-CoV-2 target nucleic acids are DETECTED.  SARS-CoV-2 RNA is generally detectable in upper respiratory specimens  during the acute phase of infection. Positive results are indicative of the presence of the identified virus, but do not rule out bacterial infection or co-infection with other pathogens not detected by the test. Clinical correlation with patient history and other diagnostic information is necessary to determine patient infection status. The expected result is Negative.  Fact Sheet for Patients:  https://www.moore.com/  Fact Sheet for Healthcare Providers: https://www.young.biz/  This test is not yet approved or cleared by the [place] FDA and  has been authorized for detection and/or diagnosis of SARS-CoV-2 by FDA under an Emergency Use Authorization (EUA).  This EUA will remain in effect (meaning this test c an be used) for the duration of  the COVID-19 declaration under Section 564(b)(1) of the Act,  21 U.S.C. section 360bbb-3(b)(1), unless the authorization is terminated or revoked sooner.      Influenza A by PCR NEGATIVE NEGATIVE Final   Influenza B by PCR NEGATIVE NEGATIVE Final    Comment: (NOTE) The Xpert Xpress SARS-CoV-2/FLU/RSV assay is intended as an aid in  the diagnosis of influenza from Nasopharyngeal swab specimens and  should not be used as a sole basis for treatment. Nasal washings and  aspirates are unacceptable for Xpert Xpress SARS-CoV-2/FLU/RSV  testing.  Fact Sheet for Patients: https://www.moore.com/  Fact Sheet for Healthcare Providers: https://www.young.biz/  This test is not yet approved or cleared by the [place] FDA and  has been authorized for detection and/or diagnosis of SARS-CoV-2 by  FDA under an Emergency Use Authorization (EUA). This EUA will remain  in effect (meaning this test can be used) for  the duration of the  Covid-19 declaration under Section 564(b)(1) of the Act, 21  U.S.C. section 360bbb-3(b)(1), unless the authorization is  terminated or revoked. Performed at [facility], [address], [place], [place] [address]     Radiology Reports DG Chest [place] 1 View  Result Date: 07/21/2020 CLINICAL DATA:  COVID-19 EXAM: PORTABLE CHEST 1 VIEW COMPARISON:  None. FINDINGS: Mild right basilar opacity. Normal pleural spaces and cardiomediastinal contours. IMPRESSION: Mild right basilar opacity may indicate atelectasis or infection. Electronically Signed   By: Deatra Robinson M.D.   On: 07/21/2020 03:10   VAS [place] LOWER EXTREMITY VENOUS (DVT)  Result Date: 07/21/2020  Lower Venous DVTStudy Indications: Pain, and Swelling.  Risk Factors: Patient pregnant. Performing Technologist: Jannet Askew RCT RDMS  Examination Guidelines: A complete evaluation includes B-mode imaging, spectral Doppler, color Doppler, and power Doppler as needed of all accessible portions of each vessel. Bilateral testing is  considered an integral part of a complete examination. Limited examinations for reoccurring indications may be performed as noted. The reflux portion of the exam is performed with the patient in reverse Trendelenburg.  +---------+---------------+---------+-----------+----------+--------------+ RIGHT    CompressibilityPhasicitySpontaneityPropertiesThrombus Aging +---------+---------------+---------+-----------+----------+--------------+ CFV      Full           Yes      Yes                                 +---------+---------------+---------+-----------+----------+--------------+ SFJ      Full                                                        +---------+---------------+---------+-----------+----------+--------------+ FV Prox  Full                                                        +---------+---------------+---------+-----------+----------+--------------+ FV Mid   Full                                                        +---------+---------------+---------+-----------+----------+--------------+ FV DistalFull                                                        +---------+---------------+---------+-----------+----------+--------------+ PFV      Full                                                        +---------+---------------+---------+-----------+----------+--------------+ POP      Full           Yes      Yes                                 +---------+---------------+---------+-----------+----------+--------------+  PTV      Full                                                        +---------+---------------+---------+-----------+----------+--------------+ PERO     Full                                                        +---------+---------------+---------+-----------+----------+--------------+   +---------+---------------+---------+-----------+----------+--------------+ LEFT      CompressibilityPhasicitySpontaneityPropertiesThrombus Aging +---------+---------------+---------+-----------+----------+--------------+ CFV      Full           Yes      Yes                                 +---------+---------------+---------+-----------+----------+--------------+ SFJ      Full                                                        +---------+---------------+---------+-----------+----------+--------------+ FV Prox  Full                                                        +---------+---------------+---------+-----------+----------+--------------+ FV Mid   Full                                                        +---------+---------------+---------+-----------+----------+--------------+ FV DistalFull                                                        +---------+---------------+---------+-----------+----------+--------------+ PFV      Full                                                        +---------+---------------+---------+-----------+----------+--------------+ POP      Full           Yes      Yes                                 +---------+---------------+---------+-----------+----------+--------------+ PTV      Full                                                        +---------+---------------+---------+-----------+----------+--------------+  PERO     Full                                                        +---------+---------------+---------+-----------+----------+--------------+     Summary: RIGHT: - There is no evidence of deep vein thrombosis in the lower extremity.  - No cystic structure found in the popliteal fossa.  LEFT: - There is no evidence of deep vein thrombosis in the lower extremity.  - No cystic structure found in the popliteal fossa.  *See table(s) above for measurements and observations. Electronically signed by Coral ElseVance Brabham MD on 07/21/2020 at 3:12:55 PM.    Final

## 2020-07-24 DIAGNOSIS — U071 COVID-19: Secondary | ICD-10-CM

## 2020-07-24 DIAGNOSIS — O98513 Other viral diseases complicating pregnancy, third trimester: Secondary | ICD-10-CM | POA: Diagnosis not present

## 2020-07-24 LAB — CBC
HCT: 35.4 % — ABNORMAL LOW (ref 36.0–46.0)
Hemoglobin: 11.9 g/dL — ABNORMAL LOW (ref 12.0–15.0)
MCH: 31.6 pg (ref 26.0–34.0)
MCHC: 33.6 g/dL (ref 30.0–36.0)
MCV: 93.9 fL (ref 80.0–100.0)
Platelets: 162 10*3/uL (ref 150–400)
RBC: 3.77 MIL/uL — ABNORMAL LOW (ref 3.87–5.11)
RDW: 12.5 % (ref 11.5–15.5)
WBC: 5.9 10*3/uL (ref 4.0–10.5)
nRBC: 0 % (ref 0.0–0.2)

## 2020-07-24 LAB — COMPREHENSIVE METABOLIC PANEL
ALT: 24 U/L (ref 0–44)
AST: 26 U/L (ref 15–41)
Albumin: 2.4 g/dL — ABNORMAL LOW (ref 3.5–5.0)
Alkaline Phosphatase: 86 U/L (ref 38–126)
Anion gap: 12 (ref 5–15)
BUN: 6 mg/dL (ref 6–20)
CO2: 16 mmol/L — ABNORMAL LOW (ref 22–32)
Calcium: 8.3 mg/dL — ABNORMAL LOW (ref 8.9–10.3)
Chloride: 108 mmol/L (ref 98–111)
Creatinine, Ser: 0.6 mg/dL (ref 0.44–1.00)
GFR, Estimated: >60 mL/min (ref >60)
Glucose, Bld: 89 mg/dL (ref 70–99)
Potassium: 3.7 mmol/L (ref 3.5–5.1)
Sodium: 136 mmol/L (ref 135–145)
Total Bilirubin: 1.1 mg/dL (ref 0.3–1.2)
Total Protein: 5.8 g/dL — ABNORMAL LOW (ref 6.5–8.1)

## 2020-07-24 LAB — C-REACTIVE PROTEIN: CRP: 2.4 mg/dL — ABNORMAL HIGH (ref <1.0)

## 2020-07-24 LAB — MAGNESIUM: Magnesium: 1.7 mg/dL (ref 1.7–2.4)

## 2020-07-24 LAB — D-DIMER, QUANTITATIVE: D-Dimer, Quant: 9.81 ug/mL-FEU — ABNORMAL HIGH (ref 0.00–0.50)

## 2020-07-24 NOTE — Progress Notes (Signed)
Patient ID: Alexandra Davidson, female   DOB: April 14, 1986, 34 y.o.   MRN: [id number]  [id number] at 31+ with covid pneumonia on lovenox, tomorrow last day of remdesivir.  HD#4  Pt states had a good day, feeling better.  Still bothered by cough.  D/w pt cough supressants to decrease (I.e Delsym - guafenicin/dextromethorphan)  Primary team feels stable for discharge tomorrow.  States HD has cleared pt to come off quarantine tomorroe.  We discussed 10 days after symptoms began if no fever.  Pt has appt 11/2 at office  Primary team recommends Lovenox prophylaxis x 2 weeks at discharge (60mg  q 12 in house)  FHTs 130's mod var, + accel - appropriate for gestational age  D/W pt POC, R NST

## 2020-07-24 NOTE — Progress Notes (Addendum)
PROGRESS NOTE                                                                                                                                                                                                             Patient Demographics:    Alexandra Davidson, is a 34 y.o. female, DOB - 1986-04-07, ZSM:[id number]  Outpatient Primary MD for the patient is [facility]   Admit date - 07/21/2020   LOS - 3  Chief Complaint  Patient presents with  . Shortness of Breath       Brief Narrative: Patient is a 34 y.o. female pregnant in the 3rd trimester-presented with fever/cough/chest pain-found to have COVID-19 pneumonia.  COVID-19 vaccinated status: Unvaccinated  Significant Events: 10/24>> Admit to [facility] for COVID-19 pneumonia  Significant studies: 10/26>> CTA chest: No PE, multifocal pneumonia  COVID-19 medications: Remdesivir: 10/24>>  Antibiotics: None  Microbiology data: 10/24 >>blood culture: Negative  Procedures: None   DVT prophylaxis: Place and maintain sequential compression device Start: 07/22/20 0710 SCDs Start: 07/21/20 0537 Prophylactic Lovenox at twice daily dosing     Subjective:    Alexandra Davidson today feels better-some cough-claims that she is walking around in the room slowly but does not have any shortness of breath.  On room air.   Assessment  & Plan :   Covid 19 Viral pneumonia: Some cough continuous-no shortness of breath with ambulation-on room air at rest.  Continue Remdesivir-last dose on 10/28.  If clinical improvement continues-suspect she can be discharged on 10/28.  Given elevated D-dimers-and high risk for VTE as pregnant-recommend prophylactic Lovenox x2 weeks on discharge.  Fever: afebrile O2 requirements:  SpO2: 98 %  COVID-19 Labs: Recent Labs    07/22/20 0653 07/23/20 0634 07/24/20 0614  DDIMER 4.50* 9.69* 9.81*  CRP 4.9* 4.6*  2.4*    No results found for: BNP  Recent Labs  Lab 07/21/20 0345  PROCALCITON 0.18    Lab Results  Component Value Date   SARSCOV2NAA POSITIVE (A) 07/21/2020     Prone/Incentive Spirometry: encouraged  incentive spirometry use 3-4/hour.  Chest pain: Musculoskeletal-reproducible-related to cough/pneumonia-CTA chest negative for PE.  Supportive care  Elevated D-dimer: See above recommendation  Pregnancy in third trimester: Per primary service  Obesity: Estimated body mass index is 40.72 kg/m as calculated from the following:   Height  as of 09/30/12: 5' 7.5" (1.715 m).   Weight as of this encounter: 119.7 kg.    ABG: No results found for: PHART, PCO2ART, PO2ART, HCO3, TCO2, ACIDBASEDEF, O2SAT  Vent Settings: N/A  Condition - Guarded  Family Communication  : Patient to update family herself.  Code Status :  Full Code  Diet :  Diet Order            Diet regular Room service appropriate? Yes; Fluid consistency: Thin  Diet effective now                  Disposition Plan  :   Status is: Inpatient  Remains inpatient appropriate because:Inpatient level of care appropriate due to severity of illness   Dispo: The patient is from: Home              Anticipated d/c is to: Home              Anticipated d/c date is: 1 day              Patient currently is not medically stable to d/c.     Barriers to discharge: Complete 5 days of IV Remdesivir  Antimicorbials  :    Anti-infectives (From admission, onward)   Start     Dose/Rate Route Frequency Ordered Stop   07/22/20 1000  remdesivir 100 mg in sodium chloride 0.9 % 100 mL IVPB       "Followed by" Linked Group Details   100 mg 200 mL/hr over 30 Minutes Intravenous Daily 07/21/20 0643 07/26/20 0959   07/21/20 1500  remdesivir 100 mg in sodium chloride 0.9 % 100 mL IVPB        100 mg 200 mL/hr over 30 Minutes Intravenous Every 30 min 07/21/20 1343 07/21/20 1635   07/21/20 0730  remdesivir 100 mg in sodium  chloride 0.9 % 100 mL IVPB       "Followed by" Linked Group Details   100 mg 200 mL/hr over 30 Minutes Intravenous Every 30 min 07/21/20 0643 07/21/20 0829      Inpatient Medications  Scheduled Meds: . docusate sodium  100 mg Oral Daily  . enoxaparin (LOVENOX) injection  60 mg Subcutaneous Q12H  . pantoprazole  40 mg Oral Daily  . prenatal multivitamin  1 tablet Oral Q1200   Continuous Infusions: . remdesivir 100 mg in NS 100 mL Stopped (07/23/20 1032)   PRN Meds:.acetaminophen, calcium carbonate, guaiFENesin-dextromethorphan, zolpidem   Time Spent in minutes  25  See all Orders from today for further details   Jeoffrey Massed M.D on 07/24/2020 at 9:14 AM  To page go to [email or web address] - use universal password  [facility] -  Office  [phone]    Objective:   Vitals:   07/24/20 0605 07/24/20 0610 07/24/20 0615 07/24/20 0616  BP:    109/60  Pulse:    87  Resp:    16  Temp:    98.4 F (36.9 C)  TempSrc:    Oral  SpO2: 97% 100% 100% 98%  Weight:        Wt Readings from Last 3 Encounters:  07/22/20 119.7 kg  09/30/12 99.6 kg  08/31/12 98.2 kg     Intake/Output Summary (Last 24 hours) at 07/24/2020 0914 Last data filed at 07/23/2020 2100 Gross per 24 hour  Intake 363.34 ml  Output 2650 ml  Net -2286.66 ml     Physical Exam Gen Exam:Alert awake-not in any distress HEENT:atraumatic, normocephalic Chest: B/L clear  to auscultation anteriorly CVS:S1S2 regular Abdomen:soft non tender, non distended Extremities:no edema Neurology: Non focal Skin: no rash   Data Review:    CBC Recent Labs  Lab 07/21/20 0111 07/22/20 0653 07/23/20 0634 07/24/20 0614  WBC 7.0 5.9 5.6 5.9  HGB 12.4 11.6* 11.6* 11.9*  HCT 37.1 35.6* 35.0* 35.4*  PLT 163 136* 139* 162  MCV 93.9 95.7 93.6 93.9  MCH 31.4 31.2 31.0 31.6  MCHC 33.4 32.6 33.1 33.6  RDW 12.0 12.3 12.3 12.5    Chemistries  Recent Labs  Lab 07/21/20 0111 07/21/20 1148 07/22/20 0653  07/23/20 0634 07/24/20 0614  NA 131* 134* 132* 134* 136  K 3.7 3.6 3.7 3.4* 3.7  CL 102 104 104 107 108  CO2 19* 19* 19* 19* 16*  GLUCOSE 102* 85 84 92 89  BUN 5* <5* 5* <5* 6  CREATININE 0.62 0.62 0.80 0.65 0.60  CALCIUM 8.4* 8.2* 8.2* 8.2* 8.3*  MG  --   --  1.6* 1.6* 1.7  AST  --  ALT  --  ALKPHOS  --  73 77 84 86  BILITOT  --  1.1 1.5* 1.5* 1.1   ------------------------------------------------------------------------------------------------------------------ No results for input(s): CHOL, HDL, LDLCALC, TRIG, CHOLHDL, LDLDIRECT in the last 72 hours.  No results found for: HGBA1C ------------------------------------------------------------------------------------------------------------------ No results for input(s): TSH, T4TOTAL, T3FREE, THYROIDAB in the last 72 hours.  Invalid input(s): FREET3 ------------------------------------------------------------------------------------------------------------------ No results for input(s): VITAMINB12, FOLATE, FERRITIN, TIBC, IRON, RETICCTPCT in the last 72 hours.  Coagulation profile No results for input(s): INR, PROTIME in the last 168 hours.  Recent Labs    07/23/20 0634 07/24/20 0614  DDIMER 9.69* 9.81*    Cardiac Enzymes No results for input(s): CKMB, TROPONINI, MYOGLOBIN in the last 168 hours.  Invalid input(s): CK ------------------------------------------------------------------------------------------------------------------ No results found for: BNP  Micro Results Recent Results (from the past 240 hour(s))  Blood Culture (routine x 2)     Status: None (Preliminary result)   Collection Time: 07/21/20  4:10 AM   Specimen: BLOOD  Result Value Ref Range Status   Specimen Description BLOOD RIGHT HAND  Final   Special Requests   Final    BOTTLES DRAWN AEROBIC AND ANAEROBIC Blood Culture results may not be optimal due to an inadequate volume of blood received in culture bottles   Culture    Final    NO GROWTH 3 DAYS Performed at [facility], [address], [place], [place] [address]    Report Status PENDING  Incomplete  Blood Culture (routine x 2)     Status: None (Preliminary result)   Collection Time: 07/21/20  4:12 AM   Specimen: BLOOD  Result Value Ref Range Status   Specimen Description BLOOD LEFT ARM  Final   Special Requests   Final    BOTTLES DRAWN AEROBIC AND ANAEROBIC Blood Culture adequate volume   Culture   Final    NO GROWTH 3 DAYS Performed at [facility], [address], [place], [place] [address]    Report Status PENDING  Incomplete  Respiratory Panel by RT PCR (Flu A&B, Covid) - Nasopharyngeal Swab     Status: Abnormal   Collection Time: 07/21/20  4:14 AM   Specimen: Nasopharyngeal Swab  Result Value Ref Range Status   SARS Coronavirus 2 by RT PCR POSITIVE (A) NEGATIVE Final    Comment: RESULT CALLED TO, READ BACK BY AND VERIFIED WITH: Mardene Celeste [phone]  07/21/2020 T. TYSOR (NOTE) SARS-CoV-2 target nucleic acids are DETECTED.  SARS-CoV-2 RNA is generally detectable in upper respiratory specimens  during the acute phase of infection. Positive results are indicative of the presence of the identified virus, but do not rule out bacterial infection or co-infection with other pathogens not detected by the test. Clinical correlation with patient history and other diagnostic information is necessary to determine patient infection status. The expected result is Negative.  Fact Sheet for Patients:  https://www.moore.com/https://www.fda.gov/media/142436/download  Fact Sheet for Healthcare Providers: https://www.young.biz/https://www.fda.gov/media/142435/download  This test is not yet approved or cleared by the [place] FDA and  has been authorized for detection and/or diagnosis of SARS-CoV-2 by FDA under an Emergency Use Authorization (EUA).  This EUA will remain in effect (meaning this test c an be used) for the duration of  the COVID-19 declaration under Section 564(b)(1)  of the Act, 21 U.S.C. section 360bbb-3(b)(1), unless the authorization is terminated or revoked sooner.      Influenza A by PCR NEGATIVE NEGATIVE Final   Influenza B by PCR NEGATIVE NEGATIVE Final    Comment: (NOTE) The Xpert Xpress SARS-CoV-2/FLU/RSV assay is intended as an aid in  the diagnosis of influenza from Nasopharyngeal swab specimens and  should not be used as a sole basis for treatment. Nasal washings and  aspirates are unacceptable for Xpert Xpress SARS-CoV-2/FLU/RSV  testing.  Fact Sheet for Patients: https://www.moore.com/https://www.fda.gov/media/142436/download  Fact Sheet for Healthcare Providers: https://www.young.biz/https://www.fda.gov/media/142435/download  This test is not yet approved or cleared by the [place] FDA and  has been authorized for detection and/or diagnosis of SARS-CoV-2 by  FDA under an Emergency Use Authorization (EUA). This EUA will remain  in effect (meaning this test can be used) for the duration of the  Covid-19 declaration under Section 564(b)(1) of the Act, 21  U.S.C. section 360bbb-3(b)(1), unless the authorization is  terminated or revoked. Performed at [facility], [address] [place], [place] [address]     Radiology Reports CT ANGIO CHEST PE W OR WO CONTRAST  Result Date: 07/23/2020 CLINICAL DATA:  Shortness of breath. COVID-19 positive. Positive D-dimer study EXAM: CT ANGIOGRAPHY CHEST WITH CONTRAST TECHNIQUE: Multidetector CT imaging of the chest was performed using the standard protocol during bolus administration of intravenous contrast. Multiplanar CT image reconstructions and MIPs were obtained to evaluate the vascular anatomy. Patient's abdomen shielded for this study. CONTRAST:  75mL OMNIPAQUE IOHEXOL 350 MG/ML SOLN COMPARISON:  Chest radiograph July 21, 2020 FINDINGS: Cardiovascular: There is no demonstrable pulmonary embolus. There is no thoracic aortic aneurysm or dissection. Visualized great vessels appear normal. No evident pericardial effusion  or pericardial thickening. Mediastinum/Nodes: Visualized thyroid appears unremarkable. There is no evident thoracic adenopathy. A small amount of thymic tissue is present which may be within normal limits for age. There is a small hiatal hernia. Lungs/Pleura: There is patchy airspace opacity in the lungs bilaterally, somewhat more on the right than on the left. No foci of consolidation evident. No cavitation. No pleural effusions. Upper Abdomen: Visualized upper abdominal lesions appear unremarkable. Musculoskeletal: No blastic or lytic bone lesions are evident. No chest wall lesions appreciable. Review of the MIP images confirms the above findings. IMPRESSION: 1. No evident pulmonary embolus. No thoracic aortic aneurysm or dissection. 2. Areas of patchy airspace opacity bilaterally consistent with multifocal pneumonia. Appearance is consistent with COVID 19 pneumonitis. No pleural effusions. 3.  No evident adenopathy. 4.  Small hiatal hernia. Electronically Signed   By: Bretta BangWilliam  Woodruff III M.D.  On: 07/23/2020 14:00   DG Chest Port 1 View  Result Date: 07/21/2020 CLINICAL DATA:  COVID-19 EXAM: PORTABLE CHEST 1 VIEW COMPARISON:  None. FINDINGS: Mild right basilar opacity. Normal pleural spaces and cardiomediastinal contours. IMPRESSION: Mild right basilar opacity may indicate atelectasis or infection. Electronically Signed   By: Deatra Robinson M.D.   On: 07/21/2020 03:10   VAS [place] LOWER EXTREMITY VENOUS (DVT)  Result Date: 07/21/2020  Lower Venous DVTStudy Indications: Pain, and Swelling.  Risk Factors: Patient pregnant. Performing Technologist: Jannet Askew RCT RDMS  Examination Guidelines: A complete evaluation includes B-mode imaging, spectral Doppler, color Doppler, and power Doppler as needed of all accessible portions of each vessel. Bilateral testing is considered an integral part of a complete examination. Limited examinations for reoccurring indications may be performed as noted. The reflux  portion of the exam is performed with the patient in reverse Trendelenburg.  +---------+---------------+---------+-----------+----------+--------------+ RIGHT    CompressibilityPhasicitySpontaneityPropertiesThrombus Aging +---------+---------------+---------+-----------+----------+--------------+ CFV      Full           Yes      Yes                                 +---------+---------------+---------+-----------+----------+--------------+ SFJ      Full                                                        +---------+---------------+---------+-----------+----------+--------------+ FV Prox  Full                                                        +---------+---------------+---------+-----------+----------+--------------+ FV Mid   Full                                                        +---------+---------------+---------+-----------+----------+--------------+ FV DistalFull                                                        +---------+---------------+---------+-----------+----------+--------------+ PFV      Full                                                        +---------+---------------+---------+-----------+----------+--------------+ POP      Full           Yes      Yes                                 +---------+---------------+---------+-----------+----------+--------------+ PTV      Full                                                        +---------+---------------+---------+-----------+----------+--------------+  PERO     Full                                                        +---------+---------------+---------+-----------+----------+--------------+   +---------+---------------+---------+-----------+----------+--------------+ LEFT     CompressibilityPhasicitySpontaneityPropertiesThrombus Aging +---------+---------------+---------+-----------+----------+--------------+ CFV      Full           Yes      Yes                                  +---------+---------------+---------+-----------+----------+--------------+ SFJ      Full                                                        +---------+---------------+---------+-----------+----------+--------------+ FV Prox  Full                                                        +---------+---------------+---------+-----------+----------+--------------+ FV Mid   Full                                                        +---------+---------------+---------+-----------+----------+--------------+ FV DistalFull                                                        +---------+---------------+---------+-----------+----------+--------------+ PFV      Full                                                        +---------+---------------+---------+-----------+----------+--------------+ POP      Full           Yes      Yes                                 +---------+---------------+---------+-----------+----------+--------------+ PTV      Full                                                        +---------+---------------+---------+-----------+----------+--------------+ PERO     Full                                                        +---------+---------------+---------+-----------+----------+--------------+  Summary: RIGHT: - There is no evidence of deep vein thrombosis in the lower extremity.  - No cystic structure found in the popliteal fossa.  LEFT: - There is no evidence of deep vein thrombosis in the lower extremity.  - No cystic structure found in the popliteal fossa.  *See table(s) above for measurements and observations. Electronically signed by Coral Else MD on 07/21/2020 at 3:12:55 PM.    Final

## 2020-07-24 NOTE — Progress Notes (Signed)
Patient ID: Alexandra Davidson, female   DOB: 26-Sep-1986, 34 y.o.   MRN: [id number]  [id number] at 31wk with covid pneumonia on lovenox, remdesivir HD#4  Talked to pt via telephone +FM, no LOF, no VB, no ctx.  Feeling better  AFVSS pulse ox >95%  gen NAD FHTs 140-150, mod var, + accel, R NST toco none  CTA - no PE, multifocal pneumonia  Continue current mgmt Daily NST

## 2020-07-25 ENCOUNTER — Other Ambulatory Visit (HOSPITAL_COMMUNITY): Payer: Self-pay | Admitting: Obstetrics and Gynecology

## 2020-07-25 DIAGNOSIS — O98513 Other viral diseases complicating pregnancy, third trimester: Secondary | ICD-10-CM | POA: Diagnosis not present

## 2020-07-25 DIAGNOSIS — U071 COVID-19: Secondary | ICD-10-CM | POA: Diagnosis not present

## 2020-07-25 LAB — COMPREHENSIVE METABOLIC PANEL
ALT: 24 U/L (ref 0–44)
AST: 29 U/L (ref 15–41)
Albumin: 2.3 g/dL — ABNORMAL LOW (ref 3.5–5.0)
Alkaline Phosphatase: 82 U/L (ref 38–126)
Anion gap: 7 (ref 5–15)
BUN: 6 mg/dL (ref 6–20)
CO2: 19 mmol/L — ABNORMAL LOW (ref 22–32)
Calcium: 8.3 mg/dL — ABNORMAL LOW (ref 8.9–10.3)
Chloride: 109 mmol/L (ref 98–111)
Creatinine, Ser: 0.61 mg/dL (ref 0.44–1.00)
GFR, Estimated: >60 mL/min (ref >60)
Glucose, Bld: 88 mg/dL (ref 70–99)
Potassium: 3.7 mmol/L (ref 3.5–5.1)
Sodium: 135 mmol/L (ref 135–145)
Total Bilirubin: 1 mg/dL (ref 0.3–1.2)
Total Protein: 5.6 g/dL — ABNORMAL LOW (ref 6.5–8.1)

## 2020-07-25 LAB — CBC
HCT: 33.9 % — ABNORMAL LOW (ref 36.0–46.0)
Hemoglobin: 11.1 g/dL — ABNORMAL LOW (ref 12.0–15.0)
MCH: 30.7 pg (ref 26.0–34.0)
MCHC: 32.7 g/dL (ref 30.0–36.0)
MCV: 93.6 fL (ref 80.0–100.0)
Platelets: 156 10*3/uL (ref 150–400)
RBC: 3.62 MIL/uL — ABNORMAL LOW (ref 3.87–5.11)
RDW: 12.3 % (ref 11.5–15.5)
WBC: 8 10*3/uL (ref 4.0–10.5)
nRBC: 0 % (ref 0.0–0.2)

## 2020-07-25 LAB — MAGNESIUM: Magnesium: 1.6 mg/dL — ABNORMAL LOW (ref 1.7–2.4)

## 2020-07-25 LAB — C-REACTIVE PROTEIN: CRP: 1.3 mg/dL — ABNORMAL HIGH (ref <1.0)

## 2020-07-25 LAB — D-DIMER, QUANTITATIVE: D-Dimer, Quant: 8.19 ug/mL-FEU — ABNORMAL HIGH (ref 0.00–0.50)

## 2020-07-25 MED ORDER — ENOXAPARIN SODIUM 60 MG/0.6ML ~~LOC~~ SOLN: 60.0000 mg | Freq: Two times a day (BID) | SUBCUTANEOUS | 0 refills | Status: DC

## 2020-07-25 MED ORDER — GUAIFENESIN-DM 100-10 MG/5ML PO SYRP: 5.0000 mL | ORAL_SOLUTION | ORAL | 0 refills | Status: AC | PRN

## 2020-07-25 MED ORDER — MAGNESIUM SULFATE 2 GM/50ML IV SOLN
2.0000 g | Freq: Once | INTRAVENOUS | Status: AC
  Administered 2020-07-25: 2 g via INTRAVENOUS
  Filled 2020-07-25: qty 50

## 2020-07-25 MED FILL — ENOXAPARIN 60 MG/0.6 ML SYR: 60 | 14 days supply | Qty: 17 | Fill #0

## 2020-07-25 MED FILL — ROBAFEN DM CGH-CHEST CONG S: 10-100 | 10 days supply | Qty: 236 | Fill #0

## 2020-07-25 NOTE — Progress Notes (Signed)
PROGRESS NOTE                                                                                                                                                                                                             Patient Demographics:    Alexandra Davidson, is a 34 y.o. female, DOB - 1986/05/14, WUJ:[id number]  Outpatient Primary MD for the patient is [facility]   Admit date - 07/21/2020   LOS - 4  Chief Complaint  Patient presents with  . Shortness of Breath       Brief Narrative: Patient is a 34 y.o. female pregnant in the 3rd trimester-presented with fever/cough/chest pain-found to have COVID-19 pneumonia.  COVID-19 vaccinated status: Unvaccinated  Significant Events: 10/24>> Admit to [facility] for COVID-19 pneumonia  Significant studies: 10/26>> CTA chest: No PE, multifocal pneumonia 10/24>>lower ext doppler:neg for DVT  COVID-19 medications: Remdesivir: 10/24>>10/28  Antibiotics: None  Microbiology data: 10/24 >>blood culture: Negative  Procedures: None   DVT prophylaxis: Place and maintain sequential compression device Start: 07/22/20 0710 SCDs Start: 07/21/20 0537 Prophylactic Lovenox at twice daily dosing     Subjective:   Feels better-on room air-only complaint is some cough. NO SOB at rest or with ambulation   Assessment  & Plan :   Covid 19 Viral pneumonia: improved-some lingering cough-but claims she walked in the room yesterday multiple times wihtout SOB. Ok to be discharged home today after she completes her last dose of remdesivir. Given high risk of VTE-pregnant/covid with elevated dimer-recommend prophylactic lovenox at daily dosing on discharge for approximately 2 weeks. Discussed rationale/risks/benefits of prophylactic lovenox-patient agreeable. Recommend checking CBC weekly while on lovenox.   TRH will sign off-please reconsult prn.  thanks  Fever: afebrile O2 requirements:  SpO2:  (98)  COVID-19 Labs: Recent Labs    07/23/20 0634 07/24/20 0614 07/25/20 0605  DDIMER 9.69* 9.81* 8.19*  CRP 4.6* 2.4* 1.3*    No results found for: BNP  Recent Labs  Lab 07/21/20 0345  PROCALCITON 0.18    Lab Results  Component Value Date   SARSCOV2NAA POSITIVE (A) 07/21/2020     Prone/Incentive Spirometry: encouraged  incentive spirometry use 3-4/hour.  Chest pain: Musculoskeletal-reproducible-related to cough/pneumonia-CTA chest negative for PE.  Supportive care  Elevated D-dimer: See above recommendation  Pregnancy in third trimester: Per primary service  Obesity: Estimated body mass index is 40.72 kg/m as calculated from the following:   Height as of 09/30/12: 5' 7.5" (1.715 m).   Weight as of this encounter: 119.7 kg.    ABG: No results found for: PHART, PCO2ART, PO2ART, HCO3, TCO2, ACIDBASEDEF, O2SAT  Vent Settings: N/A  Condition - Guarded  Family Communication  : Patient to update family herself.  Code Status :  Full Code  Diet :  Diet Order            Diet regular Room service appropriate? Yes; Fluid consistency: Thin  Diet effective now                  Disposition Plan  :   Per primary service-OK to be discharged from my perspective.   Antimicorbials  :    Anti-infectives (From admission, onward)   Start     Dose/Rate Route Frequency Ordered Stop   07/22/20 1000  remdesivir 100 mg in sodium chloride 0.9 % 100 mL IVPB       "Followed by" Linked Group Details   100 mg 200 mL/hr over 30 Minutes Intravenous Daily 07/21/20 0643 07/26/20 0959   07/21/20 1500  remdesivir 100 mg in sodium chloride 0.9 % 100 mL IVPB        100 mg 200 mL/hr over 30 Minutes Intravenous Every 30 min 07/21/20 1343 07/21/20 1635   07/21/20 0730  remdesivir 100 mg in sodium chloride 0.9 % 100 mL IVPB       "Followed by" Linked Group Details   100 mg 200 mL/hr over 30 Minutes Intravenous Every 30 min 07/21/20  0643 07/21/20 0829      Inpatient Medications  Scheduled Meds: . docusate sodium  100 mg Oral Daily  . enoxaparin (LOVENOX) injection  60 mg Subcutaneous Q12H  . pantoprazole  40 mg Oral Daily  . prenatal multivitamin  1 tablet Oral Q1200   Continuous Infusions: . magnesium sulfate bolus IVPB    . remdesivir 100 mg in NS 100 mL Stopped (07/24/20 1044)   PRN Meds:.acetaminophen, calcium carbonate, guaiFENesin-dextromethorphan, zolpidem   Time Spent in minutes  25  See all Orders from today for further details   Jeoffrey Massed M.D on 07/25/2020 at 7:34 AM  To page go to [email or web address] - use universal password  [facility] -  Office  [phone]    Objective:   Vitals:   07/25/20 0005 07/25/20 0200 07/25/20 0400 07/25/20 0435  BP: 113/63   114/74  Pulse: 79   (!) 105  Resp: 16   18  Temp: 97.9 F (36.6 C)   98.9 F (37.2 C)  TempSrc: Oral   Oral  SpO2: 98% 98% 100%   Weight:        Wt Readings from Last 3 Encounters:  07/22/20 119.7 kg  09/30/12 99.6 kg  08/31/12 98.2 kg     Intake/Output Summary (Last 24 hours) at 07/25/2020 0734 Last data filed at 07/24/2020 2059 Gross per 24 hour  Intake 1264.06 ml  Output 1800 ml  Net -535.94 ml     Physical Exam Gen Exam:Alert awake-not in any distress HEENT:atraumatic, normocephalic Chest: B/L clear to auscultation anteriorly CVS:S1S2 regular Abdomen:soft non tender, non distended Extremities:no edema Neurology: Non focal Skin: no rash   Data Review:    CBC Recent Labs  Lab 07/21/20 0111 07/22/20 0653 07/23/20 0634 07/24/20 0614 07/25/20 0605  WBC 7.0 5.9 5.6 5.9 8.0  HGB 12.4 11.6* 11.6* 11.9* 11.1*  HCT 37.1 35.6*  35.0* 35.4* 33.9*  PLT 163 136* 139* 162 156  MCV 93.9 95.7 93.6 93.9 93.6  MCH 31.4 31.2 31.0 31.6 30.7  MCHC 33.4 32.6 33.1 33.6 32.7  RDW 12.0 12.3 12.3 12.5 12.3    Chemistries  Recent Labs  Lab 07/21/20 1148 07/22/20 0653 07/23/20 0634 07/24/20 0614  07/25/20 0605  NA 134* 132* 134* 136 135  K 3.6 3.7 3.4* 3.7 3.7  CL 104 104 107 108 109  CO2 19* 19* 19* 16* 19*  GLUCOSE 85 84 92 89 88  BUN <5* 5* <5* 6 6  CREATININE 0.62 0.80 0.65 0.60 0.61  CALCIUM 8.2* 8.2* 8.2* 8.3* 8.3*  MG  --  1.6* 1.6* 1.7 1.6*  AST ALT ALKPHOS 73 77 84 86 82  BILITOT 1.1 1.5* 1.5* 1.1 1.0   ------------------------------------------------------------------------------------------------------------------ No results for input(s): CHOL, HDL, LDLCALC, TRIG, CHOLHDL, LDLDIRECT in the last 72 hours.  No results found for: HGBA1C ------------------------------------------------------------------------------------------------------------------ No results for input(s): TSH, T4TOTAL, T3FREE, THYROIDAB in the last 72 hours.  Invalid input(s): FREET3 ------------------------------------------------------------------------------------------------------------------ No results for input(s): VITAMINB12, FOLATE, FERRITIN, TIBC, IRON, RETICCTPCT in the last 72 hours.  Coagulation profile No results for input(s): INR, PROTIME in the last 168 hours.  Recent Labs    07/24/20 0614 07/25/20 0605  DDIMER 9.81* 8.19*    Cardiac Enzymes No results for input(s): CKMB, TROPONINI, MYOGLOBIN in the last 168 hours.  Invalid input(s): CK ------------------------------------------------------------------------------------------------------------------ No results found for: BNP  Micro Results Recent Results (from the past 240 hour(s))  Blood Culture (routine x 2)     Status: None (Preliminary result)   Collection Time: 07/21/20  4:10 AM   Specimen: BLOOD  Result Value Ref Range Status   Specimen Description BLOOD RIGHT HAND  Final   Special Requests   Final    BOTTLES DRAWN AEROBIC AND ANAEROBIC Blood Culture results may not be optimal due to an inadequate volume of blood received in culture bottles   Culture   Final    NO GROWTH 4  DAYS Performed at [facility], [address] [place], [place] [address]    Report Status PENDING  Incomplete  Blood Culture (routine x 2)     Status: None (Preliminary result)   Collection Time: 07/21/20  4:12 AM   Specimen: BLOOD  Result Value Ref Range Status   Specimen Description BLOOD LEFT ARM  Final   Special Requests   Final    BOTTLES DRAWN AEROBIC AND ANAEROBIC Blood Culture adequate volume   Culture   Final    NO GROWTH 4 DAYS Performed at [facility], [address] [place], [place] [address]    Report Status PENDING  Incomplete  Respiratory Panel by RT PCR (Flu A&B, Covid) - Nasopharyngeal Swab     Status: Abnormal   Collection Time: 07/21/20  4:14 AM   Specimen: Nasopharyngeal Swab  Result Value Ref Range Status   SARS Coronavirus 2 by RT PCR POSITIVE (A) NEGATIVE Final    Comment: RESULT CALLED TO, READ BACK BY AND VERIFIED WITH: Mardene Celeste [id number] 07/21/2020 T. TYSOR (NOTE) SARS-CoV-2 target nucleic acids are DETECTED.  SARS-CoV-2 RNA is generally detectable in upper respiratory specimens  during the acute phase of infection. Positive results are indicative of the presence of the identified virus, but do not rule out bacterial infection or co-infection with other pathogens not detected by the test. Clinical correlation with  patient history and other diagnostic information is necessary to determine patient infection status. The expected result is Negative.  Fact Sheet for Patients:  https://www.moore.com/https://www.fda.gov/media/142436/download  Fact Sheet for Healthcare Providers: https://www.young.biz/https://www.fda.gov/media/142435/download  This test is not yet approved or cleared by the [place] FDA and  has been authorized for detection and/or diagnosis of SARS-CoV-2 by FDA under an Emergency Use Authorization (EUA).  This EUA will remain in effect (meaning this test c an be used) for the duration of  the COVID-19 declaration under Section 564(b)(1) of the Act,  21 U.S.C. section 360bbb-3(b)(1), unless the authorization is terminated or revoked sooner.      Influenza A by PCR NEGATIVE NEGATIVE Final   Influenza B by PCR NEGATIVE NEGATIVE Final    Comment: (NOTE) The Xpert Xpress SARS-CoV-2/FLU/RSV assay is intended as an aid in  the diagnosis of influenza from Nasopharyngeal swab specimens and  should not be used as a sole basis for treatment. Nasal washings and  aspirates are unacceptable for Xpert Xpress SARS-CoV-2/FLU/RSV  testing.  Fact Sheet for Patients: https://www.moore.com/https://www.fda.gov/media/142436/download  Fact Sheet for Healthcare Providers: https://www.young.biz/https://www.fda.gov/media/142435/download  This test is not yet approved or cleared by the [place] FDA and  has been authorized for detection and/or diagnosis of SARS-CoV-2 by  FDA under an Emergency Use Authorization (EUA). This EUA will remain  in effect (meaning this test can be used) for the duration of the  Covid-19 declaration under Section 564(b)(1) of the Act, 21  U.S.C. section 360bbb-3(b)(1), unless the authorization is  terminated or revoked. Performed at [facility], [address] [place], [place] [id number]     Radiology Reports CT ANGIO CHEST PE W OR WO CONTRAST  Result Date: 07/23/2020 CLINICAL DATA:  Shortness of breath. COVID-19 positive. Positive D-dimer study EXAM: CT ANGIOGRAPHY CHEST WITH CONTRAST TECHNIQUE: Multidetector CT imaging of the chest was performed using the standard protocol during bolus administration of intravenous contrast. Multiplanar CT image reconstructions and MIPs were obtained to evaluate the vascular anatomy. Patient's abdomen shielded for this study. CONTRAST:  75mL OMNIPAQUE IOHEXOL 350 MG/ML SOLN COMPARISON:  Chest radiograph July 21, 2020 FINDINGS: Cardiovascular: There is no demonstrable pulmonary embolus. There is no thoracic aortic aneurysm or dissection. Visualized great vessels appear normal. No evident pericardial effusion or  pericardial thickening. Mediastinum/Nodes: Visualized thyroid appears unremarkable. There is no evident thoracic adenopathy. A small amount of thymic tissue is present which may be within normal limits for age. There is a small hiatal hernia. Lungs/Pleura: There is patchy airspace opacity in the lungs bilaterally, somewhat more on the right than on the left. No foci of consolidation evident. No cavitation. No pleural effusions. Upper Abdomen: Visualized upper abdominal lesions appear unremarkable. Musculoskeletal: No blastic or lytic bone lesions are evident. No chest wall lesions appreciable. Review of the MIP images confirms the above findings. IMPRESSION: 1. No evident pulmonary embolus. No thoracic aortic aneurysm or dissection. 2. Areas of patchy airspace opacity bilaterally consistent with multifocal pneumonia. Appearance is consistent with COVID 19 pneumonitis. No pleural effusions. 3.  No evident adenopathy. 4.  Small hiatal hernia. Electronically Signed   By: Bretta BangWilliam  Woodruff III M.D.   On: 07/23/2020 14:00   DG Chest Port 1 View  Result Date: 07/21/2020 CLINICAL DATA:  COVID-19 EXAM: PORTABLE CHEST 1 VIEW COMPARISON:  None. FINDINGS: Mild right basilar opacity. Normal pleural spaces and cardiomediastinal contours. IMPRESSION: Mild right basilar opacity may indicate atelectasis or infection. Electronically Signed   By: Chrisandra NettersKevin  Herman M.D.  On: 07/21/2020 03:10   VAS [place] LOWER EXTREMITY VENOUS (DVT)  Result Date: 07/21/2020  Lower Venous DVTStudy Indications: Pain, and Swelling.  Risk Factors: Patient pregnant. Performing Technologist: Jannet Askew RCT RDMS  Examination Guidelines: A complete evaluation includes B-mode imaging, spectral Doppler, color Doppler, and power Doppler as needed of all accessible portions of each vessel. Bilateral testing is considered an integral part of a complete examination. Limited examinations for reoccurring indications may be performed as noted. The reflux  portion of the exam is performed with the patient in reverse Trendelenburg.  +---------+---------------+---------+-----------+----------+--------------+ RIGHT    CompressibilityPhasicitySpontaneityPropertiesThrombus Aging +---------+---------------+---------+-----------+----------+--------------+ CFV      Full           Yes      Yes                                 +---------+---------------+---------+-----------+----------+--------------+ SFJ      Full                                                        +---------+---------------+---------+-----------+----------+--------------+ FV Prox  Full                                                        +---------+---------------+---------+-----------+----------+--------------+ FV Mid   Full                                                        +---------+---------------+---------+-----------+----------+--------------+ FV DistalFull                                                        +---------+---------------+---------+-----------+----------+--------------+ PFV      Full                                                        +---------+---------------+---------+-----------+----------+--------------+ POP      Full           Yes      Yes                                 +---------+---------------+---------+-----------+----------+--------------+ PTV      Full                                                        +---------+---------------+---------+-----------+----------+--------------+ PERO     Full                                                        +---------+---------------+---------+-----------+----------+--------------+   +---------+---------------+---------+-----------+----------+--------------+  LEFT     CompressibilityPhasicitySpontaneityPropertiesThrombus Aging +---------+---------------+---------+-----------+----------+--------------+ CFV      Full           Yes      Yes                                  +---------+---------------+---------+-----------+----------+--------------+ SFJ      Full                                                        +---------+---------------+---------+-----------+----------+--------------+ FV Prox  Full                                                        +---------+---------------+---------+-----------+----------+--------------+ FV Mid   Full                                                        +---------+---------------+---------+-----------+----------+--------------+ FV DistalFull                                                        +---------+---------------+---------+-----------+----------+--------------+ PFV      Full                                                        +---------+---------------+---------+-----------+----------+--------------+ POP      Full           Yes      Yes                                 +---------+---------------+---------+-----------+----------+--------------+ PTV      Full                                                        +---------+---------------+---------+-----------+----------+--------------+ PERO     Full                                                        +---------+---------------+---------+-----------+----------+--------------+     Summary: RIGHT: - There is no evidence of deep vein thrombosis in the lower extremity.  - No cystic structure found in the popliteal fossa.  LEFT: - There is no evidence of deep vein thrombosis in the lower extremity.  - No   cystic structure found in the popliteal fossa.  *See table(s) above for measurements and observations. Electronically signed by Coral Else MD on 07/21/2020 at 3:12:55 PM.    Final

## 2020-07-25 NOTE — Discharge Summary (Signed)
Physician Discharge Summary  Patient ID: Alexandra Davidson MRN: [id number] DOB/AGE: 1986/04/12 34 y.o.  Admit date: 07/21/2020 Discharge date: 07/25/2020  Admission Diagnoses: COVID pneumonia Preterm pregnancy at 31 1/7 weeks  Discharge Diagnoses:  Active Problems:   COVID-19 affecting pregnancy in third trimester   Discharged Condition: good  Hospital Course: Pt was admitted with COVID pneumonia when presented to ED with worsening SOB, tachypnea and tachycardia.  She did not have an oxygen requirement but given her increasing SOB and CXR c/w COVID pneumonia she was started on remdesivir and completed a 5 day course with gradual improvement.  Due to her pregnant status she was at increased risk for DVT/PE and was started on prophylactic lovenox.  SHe had an elevated D-dimer so a doppler of LE was performed and negative as well as a chest CT which r/o PE.  She will continue the lovenox at d/c for 2 weeks.  On day of d/c, she was on room air and ambulating comfortably.  SHe had no obstetric issues and fetal monitoring was reactive throughout her stay.  Consults: Hospitalist  Significant Diagnostic Studies: labs: multiple and radiology: CT scan: chest, LE dopplers  Treatments: remdesivir, lovenox  Discharge Exam: Blood pressure 117/81, pulse (!) 104, temperature 97.6 F (36.4 C), temperature source Oral, resp. rate 18, weight 119.7 kg, SpO2 95 %. General appearance: alert and cooperative Resp: clear to auscultation bilaterally GI: soft, gravid  Disposition: Discharge disposition: 01-Home or Self Care       Discharge Instructions    Call MD for:  difficulty breathing, headache or visual disturbances   Complete by: As directed    Diet - low sodium heart healthy   Complete by: As directed      Allergies as of 07/25/2020      Reactions   Dog Epithelium       Medication List    TAKE these medications   acetaminophen 500 MG tablet Commonly known as: TYLENOL Take  500-1,000 mg by mouth every 8 (eight) hours as needed for moderate pain or fever.   ascorbic acid 500 MG tablet Commonly known as: VITAMIN C Take 1,000 mg by mouth daily.   enoxaparin 60 MG/0.6ML injection Commonly known as: LOVENOX Inject 0.6 mLs (60 mg total) into the skin every 12 (twelve) hours for 14 days.   guaiFENesin-dextromethorphan 100-10 MG/5ML syrup Commonly known as: ROBITUSSIN DM Take 5 mLs by mouth every 4 (four) hours as needed for up to 10 days for cough.   pantoprazole 40 MG tablet Commonly known as: PROTONIX Take 40 mg by mouth daily.   prenatal multivitamin Tabs tablet Take 1 tablet by mouth daily.       Follow-up Information    Edwinna Areola, DO Follow up.   Specialty: Obstetrics and Gynecology Why: Keep appt as scheduled 07/30/20 Contact information: [address] [place] [address] [place] [address] [phone]               Signed: Oliver Pila 07/25/2020, 1:05 PM

## 2020-07-25 NOTE — Progress Notes (Signed)
Discharge instructions and prescriptions given to pt. Discussed signs and symptoms to report to the MD, upcoming appointments, and meds. Pt verbalizes understanding and has no questions or concerns at this time. Pt also able to verbalize how to do the lovenox injections. Pt discharged from hospital in stable condition.

## 2020-07-25 NOTE — Progress Notes (Signed)
Patient ID: [name], female   DOB: 1986/02/18, 34 y.o.   MRN: [id number] HD #4 COVID pneumonia, 31 1/[redacted] weeks gestation  Pt resting comfortably in bed Reports good FM.  Some cough but ambulating without SOB Cleared by medicine for d/c--appreciate co-management  afeb VSS FHR reactive yesterday, pending this AM  Gravid NT  Labs stable, received IV magnesium for slightly low level D-dimer slowly coming down Chest CT negative 07/23/20  d/c home after remdesiivir dose this AM NST prior to d/c Will continue lovenox at home x 2 weeks, RN has performed teaching--will see if meds to beds can provide so will not have to stop at pharmacy Has appt in office 07/30/20 and quarantine will be completed at that point.  D/w her importance of quarantine--her whole family has COVID so no need to isolate from them

## 2020-07-26 LAB — CULTURE, BLOOD (ROUTINE X 2)
Culture: NO GROWTH
Culture: NO GROWTH
Special Requests: ADEQUATE

## 2020-09-11 LAB — OB RESULTS CONSOLE GBS: GBS: POSITIVE

## 2020-09-25 ENCOUNTER — Inpatient Hospital Stay (HOSPITAL_COMMUNITY): Admission: AD | Admit: 2020-09-25 | Payer: Medicaid Other | Source: Home / Self Care

## 2020-09-28 ENCOUNTER — Other Ambulatory Visit: Payer: Self-pay | Admitting: Obstetrics and Gynecology

## 2020-09-28 NOTE — H&P (Signed)
Alexandra Davidson is a 35 y.o. female [id number] at 36 3/7 weeks (EDD 10/05/20 by 6 week [place]) presenting for IOL at term.  Prenatal care significant for: 1) Possible cardiac arrhythmia--5/21 Normal evaluation by   2) Gastroesophageal reflux disease-Protonix  3)  Maternal obesity BMI 38  Added 4)  Pneumonia caused by SARS-CoV-2        Admitted 07/21/20-07/25/20 5)  GBS carrier  OB History    Gravida  8   Para  3   Term  2   Preterm  1   AB  4   Living  3     SAB      IAB  3   Ectopic  1   Multiple      Live Births  3         01-31-2005, 35 wks 1. M, 7lbs, Vaginal Delivery  09-09-2006, 39 wks 1. F, 6lbs 14oz, Vaginal Delivery  07-14-2012, 40 wks 1. M, 7lbs 4oz, Vaginal Delivery  EAB x 2 Ectopic x 1 with partial left salpingectomy  Past Medical History:  Diagnosis Date  . Abnormal Pap smear    lsil  . COVID-19   . Ectopic pregnancy   . Preterm labor    Past Surgical History:  Procedure Laterality Date  . DILATION AND CURETTAGE OF UTERUS    . LAPAROSCOPY  08/21/2011   Procedure: LAPAROSCOPY OPERATIVE;  Surgeon: Scheryl Darter, MD;  Location: [facility];  Service: Gynecology;  Laterality: N/A;  Operative Laparoscopy with Left Partial Salpingectomy   Family History: family history includes Cancer in her paternal uncle; Diabetes in her maternal grandmother. Social History:  reports that she has never smoked. She has never used smokeless tobacco. She reports that she does not drink alcohol and does not use drugs.     Maternal Diabetes: No Genetic Screening: Normal Maternal Ultrasounds/Referrals: Normal  EFW 3789g AFI 20 Fetal Ultrasounds or other Referrals:  None Maternal Substance Abuse:  No Significant Maternal Medications:  None Significant Maternal Lab Results:  Group B Strep positive Other Comments:  None  Review of Systems  Constitutional: Negative for fever.  Respiratory: Negative for shortness of breath.   Gastrointestinal: Negative for abdominal  pain.   Maternal Medical History:  Contractions: Frequency: irregular.   Perceived severity is mild.    Fetal activity: Perceived fetal activity is normal.    Prenatal complications: Obesity, GERD, GBS positive, s/p COVID pneumonia 10/21  Prenatal Complications - Diabetes: none.      There were no vitals taken for this visit. Maternal Exam:  Uterine Assessment: Contraction strength is mild.  Contraction frequency is regular.   Abdomen: Patient reports no abdominal tenderness. Fetal presentation: vertex  Introitus: Normal vulva. Normal vagina.    Physical Exam Cardiovascular:     Rate and Rhythm: Normal rate and regular rhythm.     Heart sounds: Normal heart sounds.  Pulmonary:     Effort: Pulmonary effort is normal.  Abdominal:     Palpations: Abdomen is soft.  Genitourinary:    General: Normal vulva.  Neurological:     General: No focal deficit present.     Mental Status: She is alert.  Psychiatric:        Mood and Affect: Mood normal.     Prenatal labs: ABO, Rh: --/--/O POS (10/24 5329) Antibody: NEG (10/24 0426) Rubella:  Immune RPR:   NR HBsAg:   Neg HIV:   NR GBS:   pos Hgb AA NIPT low risk One hour GCT 119  Inheritest carrier screen negative   Assessment/Plan: PCN for +GBS then pitocin and AROM.   Oliver Pila 09/28/2020, 8:25 PM

## 2020-09-28 NOTE — L&D Delivery Note (Signed)
Delivery Note Pt progressed rapidly to complete dilation and pushed well, spontaneously rotating baby from OP to OA.  At 11:21 AM a healthy female was delivered via  (Presentation:   ROA   ).  APGAR: 9, 9; weight pending.   Placenta status: delivered spontaneously.  Cord: 3 vessels with the following complications: Nuchal x 1 delivered through.  Anesthesia: Epidural Episiotomy: none Lacerations:  Right labial Suture Repair: 3.0 vicryl rapide Est. Blood Loss (mL):   Mom to postpartum.  Baby to Couplet care / Skin to Skin. D/w pt circumcision and they would like to proceed.  Alexandra Davidson 09/29/2020, 11:45 AM

## 2020-09-28 NOTE — H&P (View-Only) (Signed)
Alexandra Davidson is a 35 y.o. female [id number] at 36 3/7 weeks (EDD 10/05/20 by 6 week [place]) presenting for IOL at term.  Prenatal care significant for: 1) Possible cardiac arrhythmia--5/21 Normal evaluation by   2) Gastroesophageal reflux disease-Protonix  3)  Maternal obesity BMI 38  Added 4)  Pneumonia caused by SARS-CoV-2        Admitted 07/21/20-07/25/20 5)  GBS carrier  OB History    Gravida  8   Para  3   Term  2   Preterm  1   AB  4   Living  3     SAB      IAB  3   Ectopic  1   Multiple      Live Births  3         01-31-2005, 35 wks 1. M, 7lbs, Vaginal Delivery  09-09-2006, 39 wks 1. F, 6lbs 14oz, Vaginal Delivery  07-14-2012, 40 wks 1. M, 7lbs 4oz, Vaginal Delivery  EAB x 2 Ectopic x 1 with partial left salpingectomy  Past Medical History:  Diagnosis Date  . Abnormal Pap smear    lsil  . COVID-19   . Ectopic pregnancy   . Preterm labor    Past Surgical History:  Procedure Laterality Date  . DILATION AND CURETTAGE OF UTERUS    . LAPAROSCOPY  08/21/2011   Procedure: LAPAROSCOPY OPERATIVE;  Surgeon: Scheryl Darter, MD;  Location: [facility];  Service: Gynecology;  Laterality: N/A;  Operative Laparoscopy with Left Partial Salpingectomy   Family History: family history includes Cancer in her paternal uncle; Diabetes in her maternal grandmother. Social History:  reports that she has never smoked. She has never used smokeless tobacco. She reports that she does not drink alcohol and does not use drugs.     Maternal Diabetes: No Genetic Screening: Normal Maternal Ultrasounds/Referrals: Normal  EFW 3789g AFI 20 Fetal Ultrasounds or other Referrals:  None Maternal Substance Abuse:  No Significant Maternal Medications:  None Significant Maternal Lab Results:  Group B Strep positive Other Comments:  None  Review of Systems  Constitutional: Negative for fever.  Respiratory: Negative for shortness of breath.   Gastrointestinal: Negative for abdominal  pain.   Maternal Medical History:  Contractions: Frequency: irregular.   Perceived severity is mild.    Fetal activity: Perceived fetal activity is normal.    Prenatal complications: Obesity, GERD, GBS positive, s/p COVID pneumonia 10/21  Prenatal Complications - Diabetes: none.      There were no vitals taken for this visit. Maternal Exam:  Uterine Assessment: Contraction strength is mild.  Contraction frequency is regular.   Abdomen: Patient reports no abdominal tenderness. Fetal presentation: vertex  Introitus: Normal vulva. Normal vagina.    Physical Exam Cardiovascular:     Rate and Rhythm: Normal rate and regular rhythm.     Heart sounds: Normal heart sounds.  Pulmonary:     Effort: Pulmonary effort is normal.  Abdominal:     Palpations: Abdomen is soft.  Genitourinary:    General: Normal vulva.  Neurological:     General: No focal deficit present.     Mental Status: She is alert.  Psychiatric:        Mood and Affect: Mood normal.     Prenatal labs: ABO, Rh: --/--/O POS (10/24 5329) Antibody: NEG (10/24 0426) Rubella:  Immune RPR:   NR HBsAg:   Neg HIV:   NR GBS:   pos Hgb AA NIPT low risk One hour GCT 119  Inheritest carrier screen negative   Assessment/Plan: PCN for +GBS then pitocin and AROM.   Rabon Scholle W Delany Steury 09/28/2020, 8:25 PM    

## 2020-09-29 ENCOUNTER — Inpatient Hospital Stay (HOSPITAL_COMMUNITY)
Admission: AD | Admit: 2020-09-29 | Discharge: 2020-10-01 | DRG: 807 | Disposition: A | Discharge status: Home / Self Care | Payer: Medicaid Other | Attending: Obstetrics and Gynecology | Admitting: Obstetrics and Gynecology

## 2020-09-29 ENCOUNTER — Encounter (HOSPITAL_COMMUNITY): Payer: Self-pay | Admitting: Obstetrics and Gynecology

## 2020-09-29 ENCOUNTER — Other Ambulatory Visit: Payer: Self-pay | Admitting: Obstetrics and Gynecology

## 2020-09-29 ENCOUNTER — Inpatient Hospital Stay (HOSPITAL_COMMUNITY): Payer: Medicaid Other | Admitting: Anesthesiology

## 2020-09-29 ENCOUNTER — Inpatient Hospital Stay (HOSPITAL_COMMUNITY): Payer: Medicaid Other

## 2020-09-29 ENCOUNTER — Other Ambulatory Visit: Payer: Self-pay

## 2020-09-29 DIAGNOSIS — O99214 Obesity complicating childbirth: Secondary | ICD-10-CM | POA: Diagnosis present

## 2020-09-29 DIAGNOSIS — K219 Gastro-esophageal reflux disease without esophagitis: Secondary | ICD-10-CM | POA: Diagnosis present

## 2020-09-29 DIAGNOSIS — Z8616 Personal history of COVID-19: Secondary | ICD-10-CM

## 2020-09-29 DIAGNOSIS — O9962 Diseases of the digestive system complicating childbirth: Secondary | ICD-10-CM | POA: Diagnosis present

## 2020-09-29 DIAGNOSIS — Z3A40 40 weeks gestation of pregnancy: Secondary | ICD-10-CM | POA: Diagnosis not present

## 2020-09-29 DIAGNOSIS — O99824 Streptococcus B carrier state complicating childbirth: Secondary | ICD-10-CM | POA: Diagnosis present

## 2020-09-29 DIAGNOSIS — O26893 Other specified pregnancy related conditions, third trimester: Secondary | ICD-10-CM | POA: Diagnosis present

## 2020-09-29 LAB — CBC
HCT: 36.9 % (ref 36.0–46.0)
Hemoglobin: 12.2 g/dL (ref 12.0–15.0)
MCH: 31.7 pg (ref 26.0–34.0)
MCHC: 33.1 g/dL (ref 30.0–36.0)
MCV: 95.8 fL (ref 80.0–100.0)
Platelets: 222 10*3/uL (ref 150–400)
RBC: 3.85 MIL/uL — ABNORMAL LOW (ref 3.87–5.11)
RDW: 12.9 % (ref 11.5–15.5)
WBC: 11.1 10*3/uL — ABNORMAL HIGH (ref 4.0–10.5)
nRBC: 0 % (ref 0.0–0.2)

## 2020-09-29 LAB — TYPE AND SCREEN
ABO/RH(D): O POS
Antibody Screen: NEGATIVE

## 2020-09-29 LAB — RPR: RPR Ser Ql: NONREACTIVE

## 2020-09-29 MED ORDER — PHENYLEPHRINE 40 MCG/ML (10ML) SYRINGE FOR IV PUSH (FOR BLOOD PRESSURE SUPPORT): 80.0000 ug | PREFILLED_SYRINGE | INTRAVENOUS | Status: DC | PRN

## 2020-09-29 MED ORDER — SIMETHICONE 80 MG PO CHEW: 80.0000 mg | CHEWABLE_TABLET | ORAL | Status: DC | PRN

## 2020-09-29 MED ORDER — LACTATED RINGERS IV SOLN
500.0000 mL | Freq: Once | INTRAVENOUS | Status: AC
  Administered 2020-09-29: 500 mL via INTRAVENOUS

## 2020-09-29 MED ORDER — SENNOSIDES-DOCUSATE SODIUM 8.6-50 MG PO TABS
2.0000 | ORAL_TABLET | ORAL | Status: DC
  Administered 2020-09-30: 2 via ORAL
  Filled 2020-09-29 (×2): qty 2

## 2020-09-29 MED ORDER — ZOLPIDEM TARTRATE 5 MG PO TABS: 5.0000 mg | ORAL_TABLET | Freq: Every evening | ORAL | Status: DC | PRN

## 2020-09-29 MED ORDER — LIDOCAINE HCL (PF) 1 % IJ SOLN: 30.0000 mL | INTRAMUSCULAR | Status: DC | PRN

## 2020-09-29 MED ORDER — IBUPROFEN 600 MG PO TABS
600.0000 mg | ORAL_TABLET | Freq: Four times a day (QID) | ORAL | Status: DC
  Administered 2020-09-29 – 2020-10-01 (×7): 600 mg via ORAL
  Filled 2020-09-29 (×7): qty 1

## 2020-09-29 MED ORDER — TETANUS-DIPHTH-ACELL PERTUSSIS 5-2.5-18.5 LF-MCG/0.5 IM SUSY: 0.5000 mL | PREFILLED_SYRINGE | Freq: Once | INTRAMUSCULAR | Status: DC

## 2020-09-29 MED ORDER — BUTORPHANOL TARTRATE 1 MG/ML IJ SOLN: 1.0000 mg | INTRAMUSCULAR | Status: DC | PRN

## 2020-09-29 MED ORDER — LIDOCAINE HCL (PF) 1 % IJ SOLN
INTRAMUSCULAR | Status: DC | PRN
  Administered 2020-09-29: 2 mL via EPIDURAL
  Administered 2020-09-29: 3 mL via EPIDURAL
  Administered 2020-09-29: 5 mL via EPIDURAL

## 2020-09-29 MED ORDER — LACTATED RINGERS IV SOLN: 500.0000 mL | Freq: Once | INTRAVENOUS | Status: DC

## 2020-09-29 MED ORDER — ONDANSETRON HCL 4 MG PO TABS: 4.0000 mg | ORAL_TABLET | ORAL | Status: DC | PRN

## 2020-09-29 MED ORDER — DIBUCAINE (PERIANAL) 1 % EX OINT: 1.0000 "application " | TOPICAL_OINTMENT | CUTANEOUS | Status: DC | PRN

## 2020-09-29 MED ORDER — COCONUT OIL OIL: 1.0000 "application " | TOPICAL_OIL | Status: DC | PRN

## 2020-09-29 MED ORDER — ONDANSETRON HCL 4 MG/2ML IJ SOLN
4.0000 mg | Freq: Four times a day (QID) | INTRAMUSCULAR | Status: DC | PRN
  Administered 2020-09-29: 4 mg via INTRAVENOUS
  Filled 2020-09-29: qty 2

## 2020-09-29 MED ORDER — PENICILLIN G POT IN DEXTROSE 60000 UNIT/ML IV SOLN
3.0000 10*6.[IU] | INTRAVENOUS | Status: DC
  Filled 2020-09-29: qty 50

## 2020-09-29 MED ORDER — SODIUM CHLORIDE (PF) 0.9 % IJ SOLN
INTRAMUSCULAR | Status: DC | PRN
  Administered 2020-09-29: 12 mL/h via EPIDURAL

## 2020-09-29 MED ORDER — DIPHENHYDRAMINE HCL 25 MG PO CAPS: 25.0000 mg | ORAL_CAPSULE | Freq: Four times a day (QID) | ORAL | Status: DC | PRN

## 2020-09-29 MED ORDER — OXYTOCIN-SODIUM CHLORIDE 30-0.9 UT/500ML-% IV SOLN: 2.5000 [IU]/h | INTRAVENOUS | Status: DC

## 2020-09-29 MED ORDER — FENTANYL-BUPIVACAINE-NACL 0.5-0.125-0.9 MG/250ML-% EP SOLN
12.0000 mL/h | EPIDURAL | Status: DC | PRN
  Filled 2020-09-29: qty 250

## 2020-09-29 MED ORDER — OXYTOCIN-SODIUM CHLORIDE 30-0.9 UT/500ML-% IV SOLN
1.0000 m[IU]/min | INTRAVENOUS | Status: DC
  Administered 2020-09-29: 2 m[IU]/min via INTRAVENOUS
  Filled 2020-09-29: qty 500

## 2020-09-29 MED ORDER — PHENYLEPHRINE 40 MCG/ML (10ML) SYRINGE FOR IV PUSH (FOR BLOOD PRESSURE SUPPORT)
80.0000 ug | PREFILLED_SYRINGE | INTRAVENOUS | Status: DC | PRN
  Filled 2020-09-29: qty 10

## 2020-09-29 MED ORDER — EPHEDRINE 5 MG/ML INJ: 10.0000 mg | INTRAVENOUS | Status: DC | PRN

## 2020-09-29 MED ORDER — SOD CITRATE-CITRIC ACID 500-334 MG/5ML PO SOLN: 30.0000 mL | ORAL | Status: DC | PRN

## 2020-09-29 MED ORDER — PRENATAL MULTIVITAMIN CH
1.0000 | ORAL_TABLET | Freq: Every day | ORAL | Status: DC
  Administered 2020-09-30: 1 via ORAL
  Filled 2020-09-29: qty 1

## 2020-09-29 MED ORDER — LACTATED RINGERS IV SOLN: INTRAVENOUS | Status: DC

## 2020-09-29 MED ORDER — WITCH HAZEL-GLYCERIN EX PADS
1.0000 | MEDICATED_PAD | CUTANEOUS | Status: DC | PRN
Start: 2020-09-29 — End: 2020-10-01

## 2020-09-29 MED ORDER — ONDANSETRON HCL 4 MG/2ML IJ SOLN: 4.0000 mg | INTRAMUSCULAR | Status: DC | PRN

## 2020-09-29 MED ORDER — BUPIVACAINE HCL (PF) 0.25 % IJ SOLN
INTRAMUSCULAR | Status: DC | PRN
  Administered 2020-09-29 (×2): 5 mL via EPIDURAL

## 2020-09-29 MED ORDER — DIPHENHYDRAMINE HCL 50 MG/ML IJ SOLN: 12.5000 mg | INTRAMUSCULAR | Status: DC | PRN

## 2020-09-29 MED ORDER — OXYCODONE-ACETAMINOPHEN 5-325 MG PO TABS: 1.0000 | ORAL_TABLET | ORAL | Status: DC | PRN

## 2020-09-29 MED ORDER — OXYCODONE-ACETAMINOPHEN 5-325 MG PO TABS: 2.0000 | ORAL_TABLET | ORAL | Status: DC | PRN

## 2020-09-29 MED ORDER — ACETAMINOPHEN 325 MG PO TABS: 650.0000 mg | ORAL_TABLET | ORAL | Status: DC | PRN

## 2020-09-29 MED ORDER — LACTATED RINGERS IV SOLN: 500.0000 mL | INTRAVENOUS | Status: DC | PRN

## 2020-09-29 MED ORDER — BENZOCAINE-MENTHOL 20-0.5 % EX AERO: 1.0000 "application " | INHALATION_SPRAY | CUTANEOUS | Status: DC | PRN

## 2020-09-29 MED ORDER — TERBUTALINE SULFATE 1 MG/ML IJ SOLN: 0.2500 mg | Freq: Once | INTRAMUSCULAR | Status: DC | PRN

## 2020-09-29 MED ORDER — ACETAMINOPHEN 325 MG PO TABS
650.0000 mg | ORAL_TABLET | ORAL | Status: DC | PRN
  Administered 2020-09-29 – 2020-09-30 (×5): 650 mg via ORAL
  Filled 2020-09-29 (×5): qty 2

## 2020-09-29 MED ORDER — SODIUM CHLORIDE 0.9 % IV SOLN
5.0000 10*6.[IU] | Freq: Once | INTRAVENOUS | Status: AC
  Administered 2020-09-29: 5 10*6.[IU] via INTRAVENOUS
  Filled 2020-09-29: qty 5

## 2020-09-29 MED ORDER — OXYTOCIN BOLUS FROM INFUSION
333.0000 mL | Freq: Once | INTRAVENOUS | Status: AC
  Administered 2020-09-29: 333 mL via INTRAVENOUS

## 2020-09-29 NOTE — Lactation Note (Signed)
This note was copied from a baby's chart. Lactation Consultation Note  Patient Name: Alexandra Davidson VHQIO'N Date: 09/29/2020 Reason for consult: L&D Initial assessment Age:34 hours baby less than 60 mins old  Baby STS when [name] arrived and mom requesting BF assist.  Baby rooting,and LC assisted to latch on the right breast and baby  Sustained latched for 12 mins with swallows. Baby had nasal congestion and cleared with breast feeding.  Per mom comfortable.  Mom aware she will be seen later on the [facility] unit.     Maternal Data    Feeding Feeding Type: Breast Fed  LATCH Score Latch: Grasps breast easily, tongue down, lips flanged, rhythmical sucking.  Audible Swallowing: A few with stimulation  Type of Nipple: Everted at rest and after stimulation  Comfort (Breast/Nipple): Soft / non-tender  Hold (Positioning): Assistance needed to correctly position infant at breast and maintain latch.  LATCH Score: 8  Interventions Interventions: Breast feeding basics reviewed  Lactation Tools Discussed/Used     Consult Status Consult Status: Follow-up Date: 09/29/20 Follow-up type: In-patient    Alexandra Davidson 09/29/2020, 12:08 PM

## 2020-09-29 NOTE — Lactation Note (Signed)
This note was copied from a baby's chart. Lactation Consultation Note  Patient Name: Boy Ruey Storer VELFY'B Date: 09/29/2020 Reason for consult: Follow-up assessment Age:34 hours  F/u visit at 3 hrs of life. Mom is a P4 who nursed her 2nd and 3rd children. One child was nursed for 8 months; another for 1 year. This infant is the largest she has ever had (by about a 2 lb difference).  Infant was cueing to eat when I entered room. With Mom's permission, I assisted with latching. He latched with ease and swallows could be heard (also verified by cervical auscultation).  Mom felt cramping during the feeding. He unlatched himself 4 min later, content. Per Mom, he had recently had a couple other short, brief feedings.   Mom has no questions or concerns for me at this time. I reviewed initial (sleepy) newborn behavior and recommended feeding infant 8-12 times/day (once infant is 24 hrs old). I provided Mom with our breastfeeding brochure & made Mom aware of O/P services, breastfeeding support groups, and our phone # for post-discharge questions.   Per Mom's request, I swaddled infant and placed him in bassinet so that she could rest before I left room.    Lurline Hare [facility] 09/29/2020, 2:49 PM

## 2020-09-29 NOTE — Interval H&P Note (Signed)
History and Physical Interval Note:  09/29/2020 9:18 AM  Alexandra Davidson was admitted this AM and received PCN for + GBS.  Started on pitocin and feeling mild/moderate contractions FHR category 1  Cevix 70/4+/-2 AROM clear  D/w pt induction process in detail, would like epidural and will proceed. D/w pt baby [place] predicts EFW 8-9 lbs, pelvis feels adequate and tested to 7+.  We discussed shoulder dystocia and possible maneuvers needed if encountered.

## 2020-09-29 NOTE — Anesthesia Preprocedure Evaluation (Signed)
Anesthesia Evaluation  Patient identified by MRN, date of birth, ID band Patient awake    Reviewed: Allergy & Precautions, Patient's Chart, lab work & pertinent test results  Airway Mallampati: III  TM Distance: >3 FB     Dental   Pulmonary neg pulmonary ROS,    Pulmonary exam normal        Cardiovascular negative cardio ROS Normal cardiovascular exam     Neuro/Psych negative neurological ROS     GI/Hepatic Neg liver ROS, GERD  ,  Endo/Other  Morbid obesity  Renal/GU negative Renal ROS     Musculoskeletal   Abdominal   Peds  Hematology negative hematology ROS (+)   Anesthesia Other Findings   Reproductive/Obstetrics (+) Pregnancy                             Anesthesia Physical Anesthesia Plan  ASA: III  Anesthesia Plan: Epidural   Post-op Pain Management:    Induction:   PONV Risk Score and Plan: Treatment may vary due to age or medical condition  Airway Management Planned: Natural Airway  Additional Equipment:   Intra-op Plan:   Post-operative Plan:   Informed Consent: I have reviewed the patients History and Physical, chart, labs and discussed the procedure including the risks, benefits and alternatives for the proposed anesthesia with the patient or authorized representative who has indicated his/her understanding and acceptance.       Plan Discussed with:   Anesthesia Plan Comments:         Anesthesia Quick Evaluation

## 2020-09-29 NOTE — Anesthesia Procedure Notes (Signed)
Epidural Patient location during procedure: OB Start time: 09/29/2020 9:48 AM End time: 09/29/2020 9:54 AM  Staffing Anesthesiologist: Marcene Duos, MD Performed: anesthesiologist   Preanesthetic Checklist Completed: patient identified, IV checked, site marked, risks and benefits discussed, surgical consent, monitors and equipment checked, pre-op evaluation and timeout performed  Epidural Patient position: sitting Prep: DuraPrep and site prepped and draped Patient monitoring: continuous pulse ox and blood pressure Approach: midline Location: L3-L4 Injection technique: LOR air  Needle:  Needle type: Tuohy  Needle gauge: 17 G Needle length: 9 cm and 9 Needle insertion depth: 8 cm Catheter type: closed end flexible Catheter size: 19 Gauge Catheter at skin depth: 14 cm Test dose: negative  Assessment Events: blood not aspirated, injection not painful, no injection resistance, no paresthesia and negative IV test

## 2020-09-29 NOTE — Progress Notes (Signed)
Patient ID: Alexandra Davidson, female   DOB: [birth year], 35 y.o.   MRN: [id number] Pt received epidural but still uncomfortable  afeb vss FHR with good variablilty, +accels and mild decelerations with contractions   90/8/-1  Pt progressing rapidly, will let anesthesia know epidural not giving optimal relief

## 2020-09-30 LAB — CBC
HCT: 30.9 % — ABNORMAL LOW (ref 36.0–46.0)
Hemoglobin: 10.7 g/dL — ABNORMAL LOW (ref 12.0–15.0)
MCH: 32.6 pg (ref 26.0–34.0)
MCHC: 34.6 g/dL (ref 30.0–36.0)
MCV: 94.2 fL (ref 80.0–100.0)
Platelets: 199 10*3/uL (ref 150–400)
RBC: 3.28 MIL/uL — ABNORMAL LOW (ref 3.87–5.11)
RDW: 12.9 % (ref 11.5–15.5)
WBC: 14 10*3/uL — ABNORMAL HIGH (ref 4.0–10.5)
nRBC: 0 % (ref 0.0–0.2)

## 2020-09-30 NOTE — Progress Notes (Signed)
She will be staying until tomorrow morning

## 2020-09-30 NOTE — Progress Notes (Signed)
PPD #1 No problems, would like to go home today Afeb, VSS Fundus firm, NT at U-1 Continue routine postpartum care, will d/c this pm if baby ok to go

## 2020-09-30 NOTE — Social Work (Signed)
MOB was referred for history of depression and anxiety.   * Referral screened out by Clinical Social Worker because none of the following criteria appear to apply:  ~ History of anxiety/depression during this pregnancy, or of post-partum depression following prior delivery. ~ Diagnosis of anxiety and/or depression within last 3 years. Per chart review, MOB diagnosed in 2013.  OR * MOB's symptoms currently being treated with medication and/or therapy.  Please contact the Clinical Social Worker if needs arise, by MOB request, or if MOB scores greater than 9/yes to question 10 on Edinburgh Postpartum Depression Screen.  Fergus Throne, LCSWA Clinical Social Work [facility]  [phone] 

## 2020-09-30 NOTE — Anesthesia Postprocedure Evaluation (Signed)
Anesthesia Post Note  Patient: AMISHA POSPISIL  Procedure(s) Performed: AN AD HOC LABOR EPIDURAL     Patient location during evaluation: Mother Baby Anesthesia Type: Epidural Level of consciousness: awake and alert Pain management: pain level controlled Vital Signs Assessment: post-procedure vital signs reviewed and stable Respiratory status: spontaneous breathing, nonlabored ventilation and respiratory function stable Cardiovascular status: stable Postop Assessment: no headache, no backache and epidural receding Anesthetic complications: no   No complications documented.  Last Vitals:  Vitals:   09/29/20 2322 09/30/20 0415  BP: 116/72 105/63  Pulse: (!) 105 85  Resp: 20 18  Temp: 36.9 C 36.4 C  SpO2: 98% 99%    Last Pain:  Vitals:   09/30/20 0520  TempSrc:   PainSc: 0-No pain   Pain Goal: Patients Stated Pain Goal: 2 (09/30/20 0415)                 Junious Silk

## 2020-10-01 MED ORDER — IBUPROFEN 800 MG PO TABS: 800.0000 mg | ORAL_TABLET | Freq: Three times a day (TID) | ORAL | 1 refills | Status: AC | PRN

## 2020-10-01 NOTE — Progress Notes (Signed)
POSTPARTUM PROGRESS NOTE  Post Partum Day #2  Subjective:  No acute events overnight.  Pt denies problems with ambulating, voiding or po intake.  She denies nausea or vomiting.  Pain is well controlled.   Lochia Minimal.   Objective: Blood pressure 114/70, pulse 96, temperature 97.9 F (36.6 C), temperature source Oral, resp. rate 17, height 5' 7.5" (1.715 m), weight 126.6 kg, SpO2 100 %, unknown if currently breastfeeding.  Physical Exam:  General: alert, cooperative and no distress Lochia:normal flow Chest: CTAB Heart: RRR no m/r/g Abdomen: +BS, soft, nontender Uterine Fundus: firm, 2cm below umbilicus GU: suture intact, healing well, no purulent drainage Extremities: neg edema, neg calf TTP BL, neg Homans BL  Recent Labs    09/29/20 0637 09/30/20 0454  HGB 12.2 10.7*  HCT 36.9 30.9*    Assessment/Plan:  ASSESSMENT: DELIGHT BICKLE is a 35 y.o. [id number] s/p SVD @ [redacted]w[redacted]d. PNC c/b obesity, +COVID in October w/ PNA, GERD, GBS pos  Discharge home, Breastfeeding and Contraception Mirena IUD, RBA reviewed has had before   LOS: 2 days

## 2020-10-01 NOTE — Discharge Summary (Signed)
Postpartum Discharge Summary  Date of Service updated     Patient Name: Alexandra Davidson DOB: 24-Feb-1986 MRN: [id number]  Date of admission: 09/29/2020 Delivery date:09/29/2020  Delivering provider: Huel Cote  Date of discharge: 10/01/2020  Admitting diagnosis: Indication for care in labor and delivery, antepartum [O75.9] NSVD (normal spontaneous vaginal delivery) [O80] Intrauterine pregnancy: [redacted]w[redacted]d     Secondary diagnosis:  Active Problems:   Indication for care in labor and delivery, antepartum   NSVD (normal spontaneous vaginal delivery)  Additional problems: npne    Discharge diagnosis: Term Pregnancy Delivered                                              Post partum procedures:none Augmentation: AROM and Pitocin Complications: None  Hospital course: Induction of Labor With Vaginal Delivery   35 y.o. yo [id number] at [redacted]w[redacted]d was admitted to the hospital 09/29/2020 for induction of labor.  Indication for induction: Favorable cervix at term.  Patient had an uncomplicated labor course as follows: Membrane Rupture Time/Date: 9:14 AM ,09/29/2020   Delivery Method:Vaginal, Spontaneous  Episiotomy: None  Lacerations:  Labial  Details of delivery can be found in separate delivery note.  Patient had a routine postpartum course. Patient is discharged home 10/01/20.  Newborn Data: Birth date:09/29/2020  Birth time:11:21 AM  Gender:Female  Living status:Living  Apgars:9 ,9  Weight:4111 g  Physical exam  Vitals:   09/30/20 0415 09/30/20 1414 09/30/20 1940 10/01/20 0601  BP: 105/63 127/77 126/87 114/70  Pulse: 85 66 91 96  Resp: 18 18 16 17   Temp: 97.6 F (36.4 C) 97.7 F (36.5 C) 97.6 F (36.4 C) 97.9 F (36.6 C)  TempSrc: Oral Oral Oral Oral  SpO2: 99% 99% 100% 100%  Weight:      Height:       General: alert Lochia: appropriate Uterine Fundus: firm Incision: N/A DVT Evaluation: No evidence of DVT seen on physical exam. Negative Homan's sign. No cords or calf  tenderness. Labs: Lab Results  Component Value Date   WBC 14.0 (H) 09/30/2020   HGB 10.7 (L) 09/30/2020   HCT 30.9 (L) 09/30/2020   MCV 94.2 09/30/2020   PLT 199 09/30/2020   CMP Latest Ref Rng & Units 07/25/2020  Glucose 70 - 99 mg/dL 88  BUN 6 - 20 mg/dL 6  Creatinine 07/27/2020 - 1.60 mg/dL 1.09  Sodium 3.23 - 557 mmol/L 135  Potassium 3.5 - 5.1 mmol/L 3.7  Chloride 98 - 111 mmol/L 109  CO2 22 - 32 mmol/L 19(L)  Calcium 8.9 - 10.3 mg/dL 8.3(L)  Total Protein 6.5 - 8.1 g/dL 322)  Total Bilirubin 0.3 - 1.2 mg/dL 1.0  Alkaline Phos 38 - 126 U/L 82  AST 15 - 41 U/L 29  ALT 0 - 44 U/L 24   Edinburgh Score: Edinburgh Postnatal Depression Scale Screening Tool 09/29/2020  I have been able to laugh and see the funny side of things. 0  I have looked forward with enjoyment to things. 0  I have blamed myself unnecessarily when things went wrong. 1  I have been anxious or worried for no good reason. 0  I have felt scared or panicky for no good reason. 0  Things have been getting on top of me. 0  I have been so unhappy that I have had difficulty sleeping. 0  I have felt  sad or miserable. 0  I have been so unhappy that I have been crying. 0  The thought of harming myself has occurred to me. 0  Edinburgh Postnatal Depression Scale Total 1      After visit meds:  Allergies as of 10/01/2020      Reactions   Dog Epithelium       Medication List    STOP taking these medications   acetaminophen 500 MG tablet Commonly known as: TYLENOL   enoxaparin 60 MG/0.6ML injection Commonly known as: LOVENOX     TAKE these medications   ascorbic acid 500 MG tablet Commonly known as: VITAMIN C Take 1,000 mg by mouth daily.   ibuprofen 800 MG tablet Commonly known as: ADVIL Take 1 tablet (800 mg total) by mouth every 8 (eight) hours as needed.   pantoprazole 40 MG tablet Commonly known as: PROTONIX Take 40 mg by mouth daily.   prenatal multivitamin Tabs tablet Take 1 tablet by mouth  daily.        Discharge home in stable condition Infant Feeding: Breast Infant Disposition:home with mother Discharge instruction: per After Visit Summary and Postpartum booklet. Activity: Advance as tolerated. Pelvic rest for 6 weeks.  Diet: routine diet Anticipated Birth Control: IUD Postpartum Appointment:6 weeks Future Appointments:No future appointments. Follow up Visit:      10/01/2020 Carlisle Cater, MD

## 2022-07-26 IMAGING — CT CT ANGIO CHEST
2 of 6 series · 18 of 46 positions shown · IV contrast (omnipaque)
Comparison: Chest radiograph July 21, 2020

CLINICAL DATA: Shortness of breath. [id number] positive. Positive
D-dimer study

EXAM:
CT ANGIOGRAPHY CHEST WITH CONTRAST
TECHNIQUE: Multidetector CT imaging of the chest was performed using the
standard protocol during bolus administration of intravenous
contrast. Multiplanar CT image reconstructions and MIPs were
obtained to evaluate the vascular anatomy. Patient's abdomen
shielded for this study.
CONTRAST:  75mL OMNIPAQUE IOHEXOL 350 MG/ML SOLN

[Series 7: thins · axial · 0.80mm/px · z∈[+1265,+1441]mm · 15 of 194 slices shown]
[im 9/194  lung]
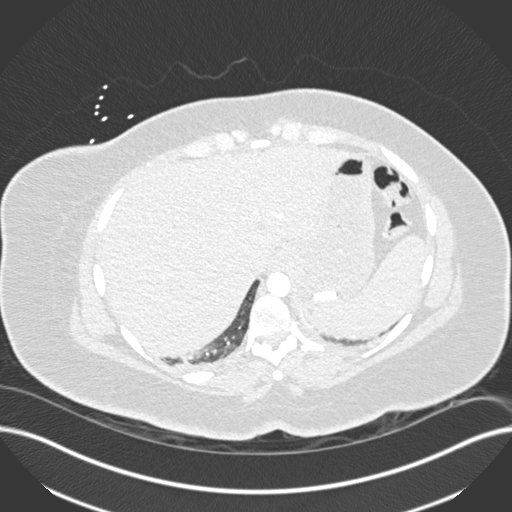
[im 26/194  soft-tissue]
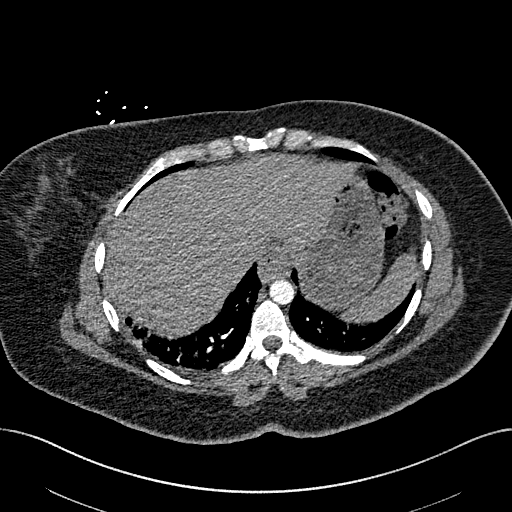
[im 34/194  lung]
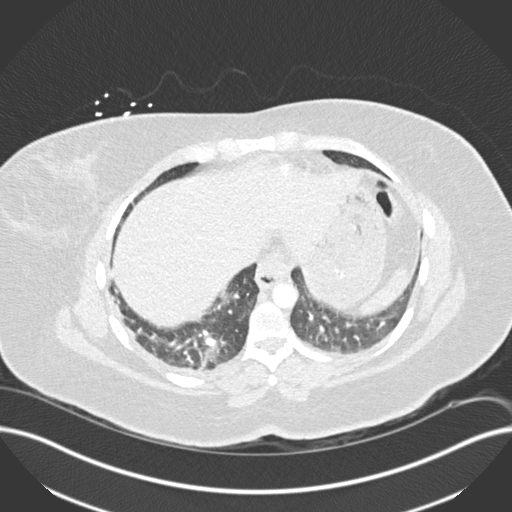
[im 51/194  soft-tissue]
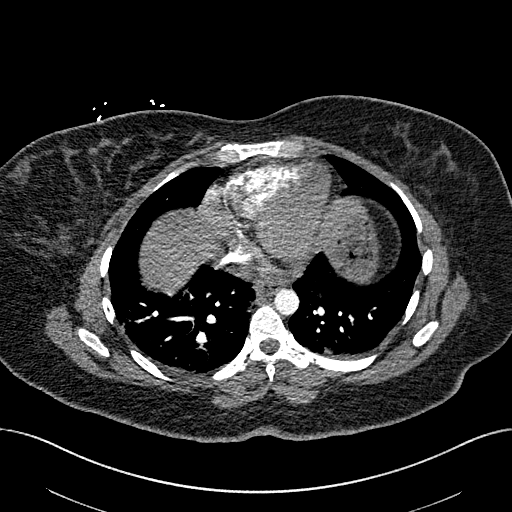
[im 59/194  lung]
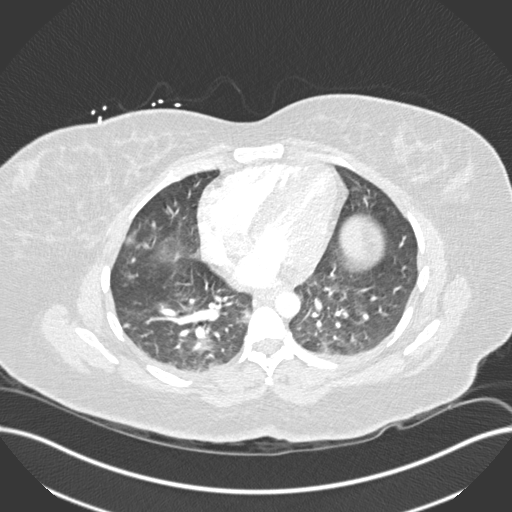
[im 76/194  soft-tissue]
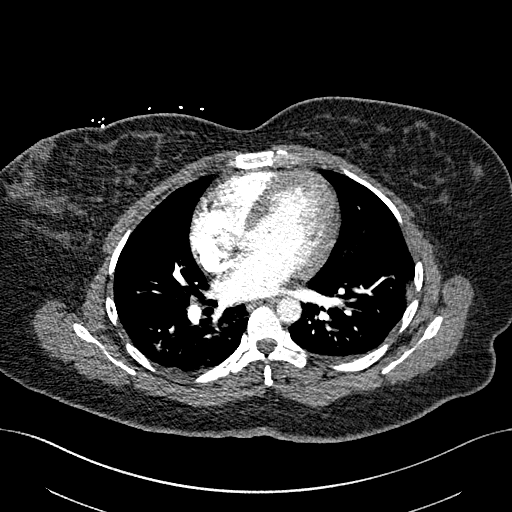
[im 84/194  lung]
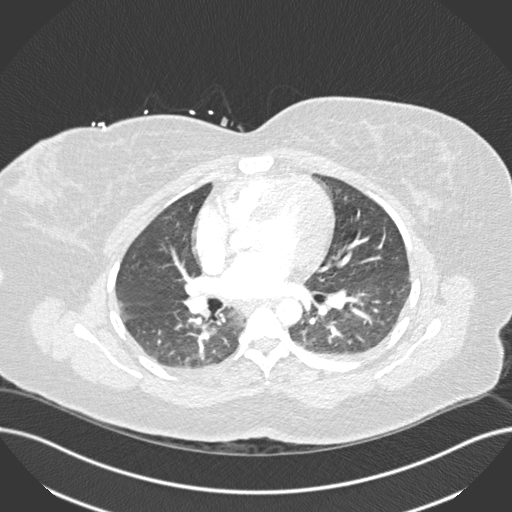
[im 101/194  soft-tissue]
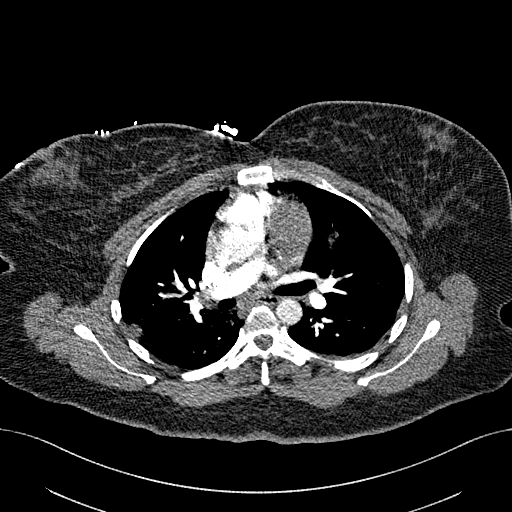
[im 110/194  lung]
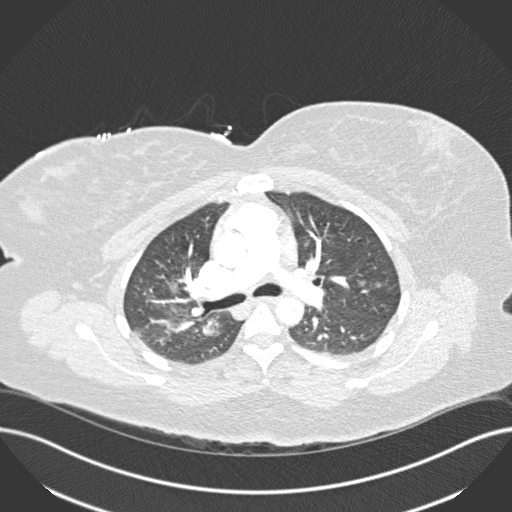
[im 118/194  soft-tissue]
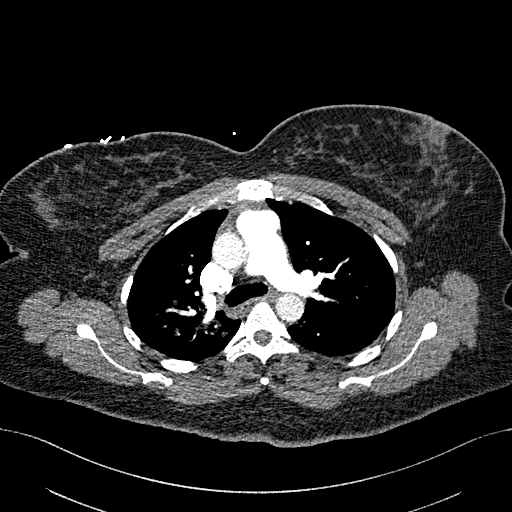
[im 135/194  lung]
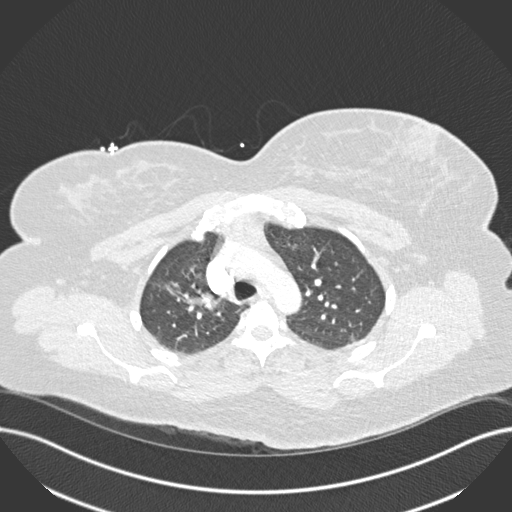
[im 143/194  soft-tissue]
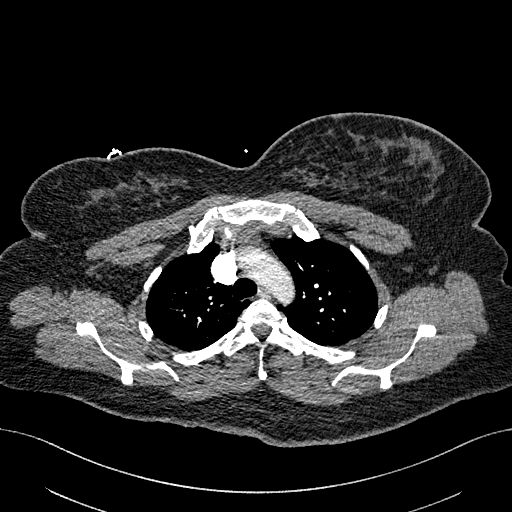
[im 160/194  lung]
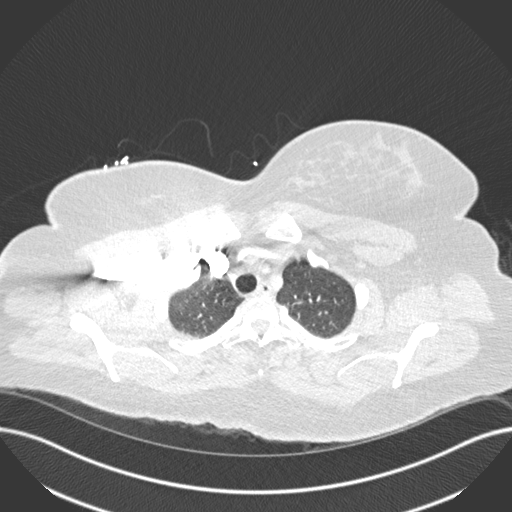
[im 168/194  soft-tissue]
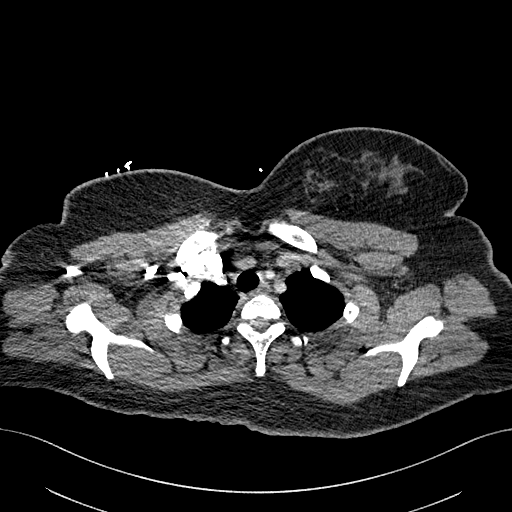
[im 185/194  lung]
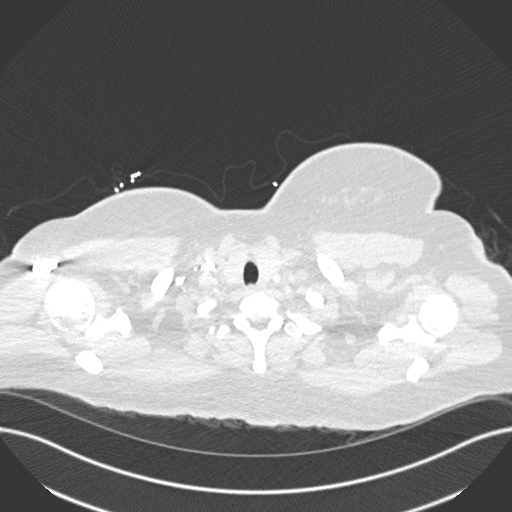

[Series 9: coronal mpr · coronal · 0.59mm/px · 3 of 151 slices shown]
[im 38/151  soft-tissue]
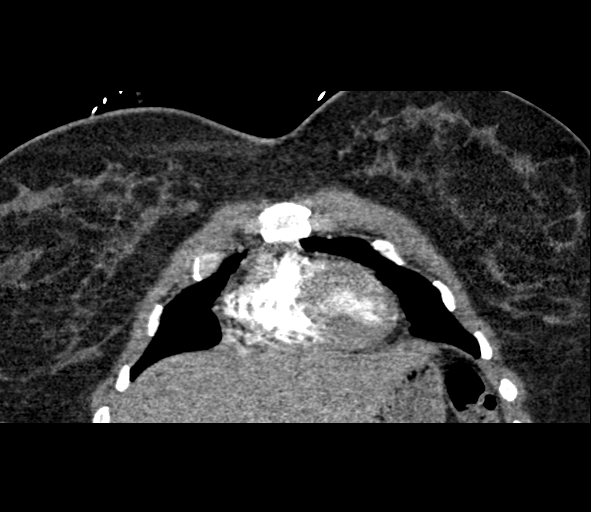
[im 76/151  soft-tissue]
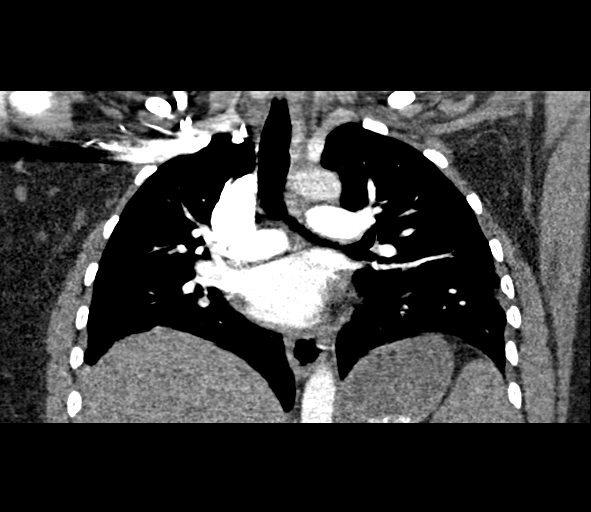
[im 113/151  soft-tissue]
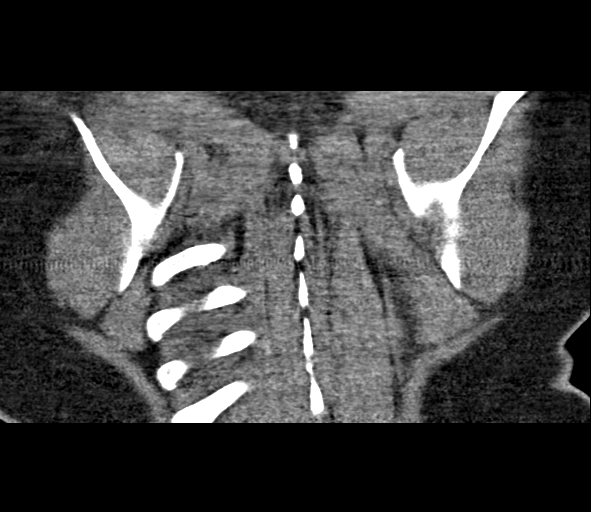

[18 of 46 positions shown; findings below may reference images not displayed]

FINDINGS: Cardiovascular: There is no demonstrable pulmonary embolus. There is
no thoracic aortic aneurysm or dissection. Visualized great vessels
appear normal. No evident pericardial effusion or pericardial
thickening.

Mediastinum/Nodes: Visualized thyroid appears unremarkable. There is
no evident thoracic adenopathy. A small amount of thymic tissue is
present which may be within normal limits for age. There is a small
hiatal hernia.

Lungs/Pleura: There is patchy airspace opacity in the lungs
bilaterally, somewhat more on the right than on the left. No foci of
consolidation evident. No cavitation. No pleural effusions.

Upper Abdomen: Visualized upper abdominal lesions appear
unremarkable.

Musculoskeletal: No blastic or lytic bone lesions are evident. No
chest wall lesions appreciable.

Review of the MIP images confirms the above findings.
IMPRESSION: 1. No evident pulmonary embolus. No thoracic aortic aneurysm or
dissection.

2. Areas of patchy airspace opacity bilaterally consistent with
multifocal pneumonia. Appearance is consistent with COVID 19
pneumonitis. No pleural effusions.

3.  No evident adenopathy.

4.  Small hiatal hernia.

## 2023-06-30 DIAGNOSIS — B9689 Other specified bacterial agents as the cause of diseases classified elsewhere: Secondary | ICD-10-CM | POA: Insufficient documentation

## 2023-07-26 ENCOUNTER — Other Ambulatory Visit (HOSPITAL_COMMUNITY): Payer: Self-pay

## 2023-07-26 MED ORDER — SEMAGLUTIDE-WEIGHT MANAGEMENT 0.25 MG/0.5ML ~~LOC~~ SOAJ
0.2500 mg | SUBCUTANEOUS | 0 refills | Status: AC
  Filled 2023-07-26 – 2023-07-28 (×3): qty 2, 28d supply, fill #0

## 2023-07-28 ENCOUNTER — Other Ambulatory Visit (HOSPITAL_COMMUNITY): Payer: Self-pay

## 2023-08-18 ENCOUNTER — Other Ambulatory Visit (HOSPITAL_COMMUNITY): Payer: Self-pay

## 2023-08-18 ENCOUNTER — Other Ambulatory Visit: Payer: Self-pay

## 2023-08-18 MED ORDER — WEGOVY 0.5 MG/0.5ML ~~LOC~~ SOAJ
0.5000 mg | SUBCUTANEOUS | 0 refills | Status: AC
Start: 2023-08-17 — End: ?
  Filled 2023-08-18: qty 2, 28d supply, fill #0

## 2023-08-18 MED ORDER — ONDANSETRON HCL 4 MG PO TABS
4.0000 mg | ORAL_TABLET | Freq: Three times a day (TID) | ORAL | 0 refills | Status: AC | PRN
  Filled 2023-08-18 – 2023-08-30 (×2): qty 30, 10d supply, fill #0

## 2023-08-19 ENCOUNTER — Other Ambulatory Visit: Payer: Self-pay

## 2023-08-23 ENCOUNTER — Other Ambulatory Visit (HOSPITAL_COMMUNITY): Payer: Self-pay

## 2023-08-30 ENCOUNTER — Other Ambulatory Visit (HOSPITAL_COMMUNITY): Payer: Self-pay

## 2023-10-24 ENCOUNTER — Other Ambulatory Visit (HOSPITAL_COMMUNITY): Payer: Self-pay

## 2023-10-26 ENCOUNTER — Other Ambulatory Visit (HOSPITAL_COMMUNITY): Payer: Self-pay

## 2023-10-26 ENCOUNTER — Encounter (HOSPITAL_COMMUNITY): Payer: Self-pay

## 2023-12-14 ENCOUNTER — Other Ambulatory Visit (HOSPITAL_COMMUNITY): Payer: Self-pay

## 2023-12-14 MED ORDER — WEGOVY 0.5 MG/0.5ML ~~LOC~~ SOAJ
0.5000 mg | SUBCUTANEOUS | 0 refills | Status: DC
  Filled 2023-12-14: qty 2, 28d supply, fill #0

## 2023-12-14 MED ORDER — ONDANSETRON HCL 4 MG PO TABS
4.0000 mg | ORAL_TABLET | Freq: Three times a day (TID) | ORAL | 0 refills | Status: AC | PRN
  Filled 2023-12-14: qty 30, 10d supply, fill #0

## 2024-02-16 ENCOUNTER — Other Ambulatory Visit (HOSPITAL_COMMUNITY): Payer: Self-pay

## 2024-02-16 MED ORDER — WEGOVY 1 MG/0.5ML ~~LOC~~ SOAJ
1.0000 mg | SUBCUTANEOUS | 0 refills | Status: DC
  Filled 2024-02-16: qty 2, 28d supply, fill #0

## 2024-02-18 ENCOUNTER — Other Ambulatory Visit (HOSPITAL_COMMUNITY): Payer: Self-pay

## 2024-02-18 ENCOUNTER — Encounter (HOSPITAL_COMMUNITY): Payer: Self-pay

## 2024-03-20 ENCOUNTER — Ambulatory Visit (INDEPENDENT_AMBULATORY_CARE_PROVIDER_SITE_OTHER)

## 2024-03-20 ENCOUNTER — Ambulatory Visit (INDEPENDENT_AMBULATORY_CARE_PROVIDER_SITE_OTHER): Admitting: Podiatry

## 2024-03-20 ENCOUNTER — Encounter

## 2024-03-20 DIAGNOSIS — M722 Plantar fascial fibromatosis: Secondary | ICD-10-CM

## 2024-03-20 DIAGNOSIS — Q6652 Congenital pes planus, left foot: Secondary | ICD-10-CM

## 2024-03-20 DIAGNOSIS — U071 COVID-19: Secondary | ICD-10-CM | POA: Insufficient documentation

## 2024-03-20 DIAGNOSIS — Q6651 Congenital pes planus, right foot: Secondary | ICD-10-CM | POA: Diagnosis not present

## 2024-03-20 DIAGNOSIS — N898 Other specified noninflammatory disorders of vagina: Secondary | ICD-10-CM | POA: Insufficient documentation

## 2024-03-20 MED ORDER — MELOXICAM 15 MG PO TABS
15.0000 mg | ORAL_TABLET | Freq: Every day | ORAL | 0 refills | Status: AC
Start: 2024-03-20 — End: ?

## 2024-03-20 NOTE — Patient Instructions (Signed)

## 2024-03-20 NOTE — Progress Notes (Unsigned)
 Chief Complaint  Patient presents with   Foot Pain    Bilateral foot pain  Pt stated that she feels her pain in the heel she stated that  it will come and go    HPI: 38 y.o. female presenting today with c/o pain in the bottom of the right heel.  It has also been present in the left, but is not hurting today.  Pain has been present for 2 months.  She also noted that she sprained her left ankle a couple weeks ago.  She is still having some swelling and tenderness to the area.  Past Medical History:  Diagnosis Date   Abnormal Pap smear    lsil   COVID-19    Ectopic pregnancy    Preterm labor    Past Surgical History:  Procedure Laterality Date   DILATION AND CURETTAGE OF UTERUS     LAPAROSCOPY  08/21/2011   Procedure: LAPAROSCOPY OPERATIVE;  Surgeon: Lynwood Solomons, MD;  Location: [facility];  Service: Gynecology;  Laterality: N/A;  Operative Laparoscopy with Left Partial Salpingectomy   Allergies  Allergen Reactions   Dog Epithelium (Canis Lupus Familiaris) Hives, Itching and Other (See Comments)   Gramineae Pollens Itching   Dog Epithelium      Physical Exam: General: The patient is alert and oriented x3 in no acute distress.  Dermatology:    No open lesions.  No ecchymosis noted bilateral.  There is localized edema to the anterior and lateral aspect of the left ankle.  Vascular: Palpable pedal pulses bilaterally. Capillary refill within normal limits.  No appreciable edema.    Neurological: Epicritic sensation is intact  Musculoskeletal Exam:  There is pain on palpation of the plantarmedial & plantarcentral aspect of right heel.  No gaps or nodules within the plantar fascia.  Positive Windlass mechanism bilateral.  Antalgic gait noted with first few steps upon standing.  No pain on palpation of achilles tendon bilateral.  Ankle df less than 10 degrees with knee extended b/l.  Radiographic Exam (bilateral foot, 3 weightbearing views, 03/20/2024):  Normal osseous  mineralization. Joint spaces preserved.  No fractures noted.  Bilateral inferior calcaneal spurs noted.  Decreased calcaneal inclination angle bilateral  Assessment/Plan of Care: 1. Plantar fasciitis, bilateral   2. Congenital pes planus of left foot   3. Congenital pes planus of right foot     Meds ordered this encounter  Medications   meloxicam (MOBIC) 15 MG tablet    Sig: Take 1 tablet (15 mg total) by mouth daily.    Dispense:  30 tablet    Refill:  0   DG FOOT COMPLETE RIGHT DG FOOT COMPLETE LEFT FOR HOME USE ONLY DME NIGHT SPLINT PR FT ARCH SUPRT PREMOLD LONGIT  -Reviewed etiology of plantar fasciitis with patient.  Discussed treatment options with patient today, including cortisone injection, NSAID course of treatment, stretching exercises, physical therapy, use of night splint, rest, icing the heel, arch supports/orthotics, and supportive shoe gear.    A night splint fitted and dispensed today for the right foot.  This is a static AFO device with soft interface material to be worn when sleeping or nonweightbearing.  The proof of delivery and ABN were signed by the patient today via motion MD.  Stretching exercises printed and dispensed today  Powerstep inserts fitted and dispensed today  An elastic ankle sleeve was fitted and dispensed today for the left ankle.  Prescription for meloxicam 15 mg 1 tablet p.o. daily was sent to  the patient's pharmacy  Return in about 4 weeks (around 04/17/2024) for f/u plantar fasciitis R foot.   Awanda CHARM Imperial, DPM, FACFAS [facility]     [address] [place], [place] [address]                Office [phone]  Fax [phone]

## 2024-04-17 ENCOUNTER — Ambulatory Visit (INDEPENDENT_AMBULATORY_CARE_PROVIDER_SITE_OTHER): Admitting: Podiatry

## 2024-04-17 DIAGNOSIS — M722 Plantar fascial fibromatosis: Secondary | ICD-10-CM | POA: Diagnosis not present

## 2024-04-17 MED ORDER — PREDNISONE 20 MG PO TABS: ORAL_TABLET | ORAL | 0 refills | Status: AC

## 2024-04-17 NOTE — Progress Notes (Signed)
 Chief Complaint  Patient presents with   Plantar Fasciitis    Right foot plantar fasciitis follow up pt stated that it has got a little better but she still has some discomfort with it here and there    HPI: 38 y.o. female presenting today for follow-up of right plantar fasciitis.  She notes that she still having pain to the area.  She states that she is not quite ready for cortisone injection yet.  She is willing to start oral some medication.  She is stretching and wearing her arch supports.  States the night splint has not been comfortable for her so she cannot tolerate that very much to wear all night.  Past Medical History:  Diagnosis Date   Abnormal Pap smear    lsil   COVID-19    Ectopic pregnancy    Preterm labor    Past Surgical History:  Procedure Laterality Date   DILATION AND CURETTAGE OF UTERUS     LAPAROSCOPY  08/21/2011   Procedure: LAPAROSCOPY OPERATIVE;  Surgeon: Lynwood Solomons, MD;  Location: [facility];  Service: Gynecology;  Laterality: N/A;  Operative Laparoscopy with Left Partial Salpingectomy   Allergies  Allergen Reactions   Dog Epithelium (Canis Lupus Familiaris) Hives, Itching and Other (See Comments)   Gramineae Pollens Itching   Dog Epithelium      Physical Exam: General: The patient is alert and oriented x3 in no acute distress.  Dermatology:  No ecchymosis, erythema, or edema bilateral.  No open lesions.    Vascular: Palpable pedal pulses bilaterally. Capillary refill within normal limits.  No appreciable edema.    Neurological: Epicritic sensation is intact  Musculoskeletal Exam:  There is pain on palpation of the plantarmedial & plantarcentral aspect of right heel.  No gaps or nodules within the plantar fascia.  Positive Windlass mechanism bilateral.  Antalgic gait noted with first few steps upon standing.  No pain on palpation of achilles tendon bilateral.  Ankle df less than 10 degrees with knee extended b/l.  Assessment/Plan of Care: 1.  Plantar fasciitis of right foot     Meds ordered this encounter  Medications   predniSONE  (DELTASONE ) 20 MG tablet    Sig: Take 2 tablets (40 mg total) by mouth daily with breakfast for 3 days, THEN 1 tablet (20 mg total) daily with breakfast for 3 days, THEN 0.5 tablets (10 mg total) daily with breakfast for 4 days.    Dispense:  11 tablet    Refill:  0   -Reviewed etiology of plantar fasciitis with patient.  Discussed treatment options with patient today, including cortisone injection, NSAID course of treatment, stretching exercises, physical therapy, use of night splint, rest, icing the heel, arch supports/orthotics, and supportive shoe gear.    Patient declined a cortisone injection today.  Will send in a tapered prednisone  pack for her to take instead.  If she changes her mind about the cortisone injection she can call and schedule this at any time.  She declined PT but may opt to pursue this in the future.  Continue with stretching exercises and arch supports.  Return in about 4 weeks (around 05/15/2024) for f/u plantar fasciitis.   Awanda CHARM Imperial, DPM, FACFAS [facility]     [address]  [place], [place] [address]                Office [phone]  Fax [phone]

## 2024-05-15 ENCOUNTER — Encounter: Admitting: Podiatry

## 2024-05-16 NOTE — Progress Notes (Signed)
 Patient did not show for her scheduled appointment on 05/15/2024
